# Patient Record
Sex: Female | Born: 1960 | Race: Black or African American | Hispanic: No | Marital: Married | Smoking: Current every day smoker
Health system: Southern US, Community
[De-identification: ages and names within clinical notes are randomized; demographics above are authoritative.]

## PROBLEM LIST (undated history)

## (undated) DIAGNOSIS — Z789 Other specified health status: Secondary | ICD-10-CM

## (undated) DIAGNOSIS — M1990 Unspecified osteoarthritis, unspecified site: Secondary | ICD-10-CM

## (undated) HISTORY — PX: FINGER SURGERY: SHX640

## (undated) HISTORY — PX: LAPAROSCOPIC BILATERAL SALPINGO OOPHERECTOMY: SHX5890

---

## 2005-03-07 ENCOUNTER — Ambulatory Visit: Payer: Self-pay | Admitting: Urology

## 2007-06-30 ENCOUNTER — Ambulatory Visit: Payer: Self-pay | Admitting: Emergency Medicine

## 2007-09-09 ENCOUNTER — Ambulatory Visit: Payer: Self-pay | Admitting: Family Medicine

## 2008-01-31 ENCOUNTER — Emergency Department: Payer: Self-pay | Admitting: Emergency Medicine

## 2008-02-11 ENCOUNTER — Ambulatory Visit: Payer: Self-pay | Admitting: Family Medicine

## 2008-04-30 ENCOUNTER — Ambulatory Visit: Payer: Self-pay | Admitting: Internal Medicine

## 2008-08-10 ENCOUNTER — Ambulatory Visit: Payer: Self-pay | Admitting: Family Medicine

## 2008-08-27 ENCOUNTER — Ambulatory Visit: Payer: Self-pay | Admitting: Internal Medicine

## 2008-10-30 ENCOUNTER — Ambulatory Visit: Payer: Self-pay | Admitting: Internal Medicine

## 2009-08-12 ENCOUNTER — Ambulatory Visit: Payer: Self-pay | Admitting: Internal Medicine

## 2010-01-11 ENCOUNTER — Ambulatory Visit: Payer: Self-pay | Admitting: Family Medicine

## 2010-03-22 ENCOUNTER — Ambulatory Visit: Payer: Self-pay | Admitting: Internal Medicine

## 2011-07-18 ENCOUNTER — Ambulatory Visit: Payer: Self-pay | Admitting: Internal Medicine

## 2011-07-22 ENCOUNTER — Ambulatory Visit: Payer: Self-pay

## 2011-07-24 ENCOUNTER — Emergency Department: Payer: Self-pay | Admitting: Emergency Medicine

## 2011-07-24 LAB — BASIC METABOLIC PANEL
Anion Gap: 7 (ref 7–16)
BUN: 15 mg/dL (ref 7–18)
Calcium, Total: 8.6 mg/dL (ref 8.5–10.1)
Chloride: 106 mmol/L (ref 98–107)
Co2: 31 mmol/L (ref 21–32)
Creatinine: 0.81 mg/dL (ref 0.60–1.30)
EGFR (African American): >60
Glucose: 126 mg/dL — ABNORMAL HIGH (ref 65–99)
Osmolality: 289 (ref 275–301)
Potassium: 3.9 mmol/L (ref 3.5–5.1)
Sodium: 144 mmol/L (ref 136–145)

## 2011-07-24 LAB — RAPID INFLUENZA A&B ANTIGENS

## 2011-07-24 LAB — CBC
MCHC: 32.6 g/dL (ref 32.0–36.0)
Platelet: 314 10*3/uL (ref 150–440)
WBC: 4.3 10*3/uL (ref 3.6–11.0)

## 2011-07-29 ENCOUNTER — Emergency Department: Payer: Self-pay | Admitting: *Deleted

## 2011-07-29 LAB — COMPREHENSIVE METABOLIC PANEL
Albumin: 3.4 g/dL (ref 3.4–5.0)
BUN: 15 mg/dL (ref 7–18)
Bilirubin,Total: 0.3 mg/dL (ref 0.2–1.0)
Chloride: 105 mmol/L (ref 98–107)
Creatinine: 0.69 mg/dL (ref 0.60–1.30)
EGFR (African American): >60
Potassium: 3.6 mmol/L (ref 3.5–5.1)
SGOT(AST): 19 U/L (ref 15–37)
SGPT (ALT): 21 U/L
Sodium: 143 mmol/L (ref 136–145)
Total Protein: 6.9 g/dL (ref 6.4–8.2)

## 2011-07-29 LAB — CBC
MCHC: 32.6 g/dL (ref 32.0–36.0)
RBC: 3.78 10*6/uL — ABNORMAL LOW (ref 3.80–5.20)
RDW: 14.1 % (ref 11.5–14.5)

## 2011-07-29 LAB — TROPONIN I: Troponin-I: <0.02 ng/mL

## 2011-07-30 LAB — URINALYSIS, COMPLETE
Bilirubin,UR: NEGATIVE
Leukocyte Esterase: NEGATIVE
RBC,UR: NONE SEEN /HPF (ref 0–5)
Squamous Epithelial: 11
WBC UR: NONE SEEN /HPF (ref 0–5)

## 2011-07-30 LAB — PRO B NATRIURETIC PEPTIDE: B-Type Natriuretic Peptide: 133 pg/mL — ABNORMAL HIGH (ref 0–125)

## 2012-04-22 ENCOUNTER — Ambulatory Visit: Payer: Self-pay

## 2013-08-15 ENCOUNTER — Ambulatory Visit: Payer: Self-pay

## 2013-08-15 LAB — URINALYSIS, COMPLETE
Bilirubin,UR: NEGATIVE
Blood: NEGATIVE
Glucose,UR: NEGATIVE mg/dL (ref 0–75)
KETONE: NEGATIVE
LEUKOCYTE ESTERASE: NEGATIVE
Nitrite: NEGATIVE
Ph: 6.5 (ref 4.5–8.0)
RBC, UR: NONE SEEN /HPF (ref 0–5)
Specific Gravity: >=1.03 (ref 1.003–1.030)
WBC UR: NONE SEEN /HPF (ref 0–5)

## 2013-11-27 ENCOUNTER — Ambulatory Visit: Payer: Self-pay | Admitting: Family Medicine

## 2014-07-14 ENCOUNTER — Ambulatory Visit: Payer: Self-pay | Admitting: Family Medicine

## 2014-12-14 ENCOUNTER — Ambulatory Visit
Admission: EM | Admit: 2014-12-14 | Discharge: 2014-12-14 | Disposition: A | Discharge status: Home / Self Care | Payer: Managed Care, Other (non HMO) | Attending: Family Medicine | Admitting: Family Medicine

## 2014-12-14 ENCOUNTER — Encounter: Payer: Self-pay | Admitting: Gynecology

## 2014-12-14 DIAGNOSIS — B373 Candidiasis of vulva and vagina: Secondary | ICD-10-CM | POA: Diagnosis not present

## 2014-12-14 DIAGNOSIS — L80 Vitiligo: Secondary | ICD-10-CM | POA: Diagnosis not present

## 2014-12-14 DIAGNOSIS — R21 Rash and other nonspecific skin eruption: Secondary | ICD-10-CM | POA: Diagnosis present

## 2014-12-14 DIAGNOSIS — B3731 Acute candidiasis of vulva and vagina: Secondary | ICD-10-CM

## 2014-12-14 LAB — URINALYSIS COMPLETE WITH MICROSCOPIC (ARMC ONLY)
BACTERIA UA: NONE SEEN — AB
Bilirubin Urine: NEGATIVE
Glucose, UA: NEGATIVE mg/dL
KETONES UR: NEGATIVE mg/dL
Leukocytes, UA: NEGATIVE
NITRITE: NEGATIVE
Protein, ur: NEGATIVE mg/dL
Specific Gravity, Urine: 1.02 (ref 1.005–1.030)
pH: 6.5 (ref 5.0–8.0)

## 2014-12-14 LAB — WET PREP, GENITAL
Clue Cells Wet Prep HPF POC: NONE SEEN
TRICH WET PREP: NONE SEEN
WBC, Wet Prep HPF POC: NONE SEEN

## 2014-12-14 MED ORDER — FLUCONAZOLE 150 MG PO TABS: 150.0000 mg | ORAL_TABLET | Freq: Every day | ORAL | Status: DC

## 2014-12-14 NOTE — ED Notes (Signed)
Patient c/o rash at vaginal region x 1 month ago

## 2014-12-14 NOTE — ED Provider Notes (Signed)
CSN: [id number]     Arrival date & time 12/14/14  [id number] History   First MD Initiated Contact with Patient 12/14/14 [phone]     Chief Complaint  Patient presents with  . Rash   (Consider location/radiation/quality/duration/timing/severity/associated sxs/prior Treatment) HPI Comments: 54 yo female with a 1 month h/o a "rash" on the outside of her vagina. Denies any pain, blisters, discharge. States feels slight vaginal itching. Denies any trauma, fevers, chills.   History reviewed. No pertinent past medical history. Past Surgical History  Procedure Laterality Date  . Abdominal hysterectomy     Family History  Problem Relation Age of Onset  . Stroke Mother   . Cancer Father    History  Substance Use Topics  . Smoking status: Current Every Day Smoker -- 2.00 packs/day  . Smokeless tobacco: Not on file  . Alcohol Use: Yes   OB History    No data available     Review of Systems  Allergies  Review of patient's allergies indicates no known allergies.  Home Medications   Prior to Admission medications   Medication Sig Start Date End Date Taking? Authorizing Provider  fluconazole (DIFLUCAN) 150 MG tablet Take 1 tablet (150 mg total) by mouth daily. 12/14/14   Payton Mccallumrlando Hailly Fess, MD   BP 103/79 mmHg  Pulse 66  Temp(Src) 97.3 F (36.3 C) (Tympanic)  Ht 5\' 6"  (1.676 m)  Wt 120 lb (54.432 kg)  BMI 19.38 kg/m2  SpO2 100% Physical Exam  Constitutional: She appears well-developed and well-nourished. No distress.  Genitourinary:    No labial fusion. There is no tenderness or injury on the right labia. There is no tenderness or injury on the left labia. No erythema, tenderness or bleeding in the vagina. No foreign body around the vagina. No signs of injury around the vagina. Vaginal discharge (slight ) found.  Hypopigmented areas out labia, vulva bilaterally; no vesicular lesions or drainage  Lymphadenopathy:       Right: No inguinal adenopathy present.       Left: No inguinal  adenopathy present.  Skin: She is not diaphoretic.  Nursing note and vitals reviewed.   ED Course  Procedures (including critical care time) Labs Review Labs Reviewed  WET PREP, GENITAL - Abnormal; Notable for the following:    Yeast Wet Prep HPF POC MANY (*)    All other components within normal limits  URINALYSIS COMPLETEWITH MICROSCOPIC ([facility] ONLY) - Abnormal; Notable for the following:    Hgb urine dipstick TRACE (*)    Bacteria, UA NONE SEEN (*)    Squamous Epithelial / LPF 0-5 (*)    All other components within normal limits    Imaging Review No results found.   MDM   1. Yeast vaginitis   2. Vitiligo    Discharge Medication List as of 12/14/2014 10:57 AM    START taking these medications   Details  fluconazole (DIFLUCAN) 150 MG tablet Take 1 tablet (150 mg total) by mouth daily., Starting 12/14/2014, Until Discontinued, Normal      Plan: 1. Test results and diagnosis reviewed with patient 2. rx as per orders; risks, benefits, potential side effects reviewed with patient  3. F/u prn if symptoms worsen or don't improve    Payton Mccallumrlando Recardo Linn, MD 12/16/14 [phone]

## 2014-12-14 NOTE — Discharge Instructions (Signed)
Candidal Vulvovaginitis Candidal vulvovaginitis is an infection of the vagina and vulva. The vulva is the skin around the opening of the vagina. This may cause itching and discomfort in and around the vagina.  HOME CARE  Only take medicine as told by your doctor.  Do not have sex (intercourse) until the infection is healed or as told by your doctor.  Practice safe sex.  Tell your sex partner about your infection.  Do not douche or use tampons.  Wear cotton underwear. Do not wear tight pants or panty hose.  Eat yogurt. This may help treat and prevent yeast infections. GET HELP RIGHT AWAY IF:   You have a fever.  Your problems get worse during treatment or do not get better in 3 days.  You have discomfort, irritation, or itching in your vagina or vulva area.  You have pain after sex.  You start to get belly (abdominal) pain. MAKE SURE YOU:  Understand these instructions.  Will watch your condition.  Will get help right away if you are not doing well or get worse. Document Released: 09/30/2008 Document Revised: 07/09/2013 Document Reviewed: 09/30/2008 [facility] Patient Information 2015 [place], [place]. This information is not intended to replace advice given to you by your health care provider. Make sure you discuss any questions you have with your health care provider.  PUVA Treatment PUVA stands for psoralen and ultraviolet A. PUVA treatment involves using ultraviolet A (UVA) light and taking a medicine, psoralen, by mouth. UVA is a form of light that is found naturally in sunlight. UVA light can also be produced by fluorescent lightbulbs such as those found in tanning booths. Psoralen is medicine that makes your skin more sensitive to UVA light. In some skin diseases, such as psoriasis, the skin cells grow faster than normal. PUVA treatment helps to slow down the skin growth and improves the skin. LET YOUR CAREGIVER KNOW ABOUT:  Allergies to food or medicine.  Medicines  taken, including vitamins, herbs, eyedrops, over-the-counter medicines, and creams.  Use of steroids (by mouth or creams).  Previous problems with anesthetics or numbing medicines.  History of bleeding problems or blood clots.  Previous surgery.  Other health problems, including diabetes and kidney problems.  Possibility of pregnancy, if this applies. RISKS AND COMPLICATIONS  Skin cancer (melanoma and squamous cell carcinoma) from exposure to UVA light.  Excessive aging of the skin.  Serious burns if you are exposed to too much UVA light.  Cataracts. Wearing goggles during treatment and UV-blocking sunglasses when outside reduces this risk. BEFORE THE PROCEDURE Psoralen medicine is taken with food or milk 2 hours before your light treatment. This gives the sensitizing medicine time to take effect. PROCEDURE  You will have complete privacy during the treatment.  You will apply safflower oil or another moisturizer to the skin or the entire body.  Covers will be provided to cover private areas in men, and to cover nipples in women.  Goggles will be worn during the entire treatment. If possible, the eyes should remain closed as well.  Treatments are done in a full body machine or in a hand and foot machine. This depends on what parts of the body need treatment. The machines are lined with fluorescent lightbulbs. It is important not to touch or come near the lightbulbs.  Treatments will begin with small doses of light for just a few seconds. This is done to gradually build up a tolerance and not get sunburned. Over time, the length of treatments will  be increased.  There will be a support bar to hold throughout the treatment.  You may need to raise your arms part of the time to expose the underarms.  You may need to keep your legs apart.  There is a "hold" button to press if you feel dizzy, feel burning of the skin, or need help. This will stop the treatment and will call the  nurse. AFTER THE PROCEDURE  You can safely drive home and return to regular activities immediately after the procedure.  Ask your caregiver when you need to return for your next treatment. You will have treatments 2 or 3 times a week. Document Released: 03/17/2011 Document Revised: 09/26/2011 Document Reviewed: 03/17/2011 [facility] Patient Information 2015 [place], [place]. This information is not intended to replace advice given to you by your health care provider. Make sure you discuss any questions you have with your health care provider.  PUVA Treatment PUVA stands for psoralen and ultraviolet A. PUVA treatment involves using ultraviolet A (UVA) light and taking a medicine, psoralen, by mouth. UVA is a form of light that is found naturally in sunlight. UVA light can also be produced by fluorescent lightbulbs such as those found in tanning booths. Psoralen is medicine that makes your skin more sensitive to UVA light. In some skin diseases, such as psoriasis, the skin cells grow faster than normal. PUVA treatment helps to slow down the skin growth and improves the skin. LET YOUR CAREGIVER KNOW ABOUT:  Allergies to food or medicine.  Medicines taken, including vitamins, herbs, eyedrops, over-the-counter medicines, and creams.  Use of steroids (by mouth or creams).  Previous problems with anesthetics or numbing medicines.  History of bleeding problems or blood clots.  Previous surgery.  Other health problems, including diabetes and kidney problems.  Possibility of pregnancy, if this applies. RISKS AND COMPLICATIONS  Skin cancer (melanoma and squamous cell carcinoma) from exposure to UVA light.  Excessive aging of the skin.  Serious burns if you are exposed to too much UVA light.  Cataracts. Wearing goggles during treatment and UV-blocking sunglasses when outside reduces this risk. BEFORE THE PROCEDURE Psoralen medicine is taken with food or milk 2 hours before your light  treatment. This gives the sensitizing medicine time to take effect. PROCEDURE  You will have complete privacy during the treatment.  You will apply safflower oil or another moisturizer to the skin or the entire body.  Covers will be provided to cover private areas in men, and to cover nipples in women.  Goggles will be worn during the entire treatment. If possible, the eyes should remain closed as well.  Treatments are done in a full body machine or in a hand and foot machine. This depends on what parts of the body need treatment. The machines are lined with fluorescent lightbulbs. It is important not to touch or come near the lightbulbs.  Treatments will begin with small doses of light for just a few seconds. This is done to gradually build up a tolerance and not get sunburned. Over time, the length of treatments will be increased.  There will be a support bar to hold throughout the treatment.  You may need to raise your arms part of the time to expose the underarms.  You may need to keep your legs apart.  There is a "hold" button to press if you feel dizzy, feel burning of the skin, or need help. This will stop the treatment and will call the nurse. AFTER THE PROCEDURE  You  can safely drive home and return to regular activities immediately after the procedure.  Ask your caregiver when you need to return for your next treatment. You will have treatments 2 or 3 times a week. Document Released: 03/17/2011 Document Revised: 09/26/2011 Document Reviewed: 03/17/2011 [facility] Patient Information 2015 [place]. This information is not intended to replace advice given to you by your health care provider. Make sure you discuss any questions you have with your health care provider.

## 2016-03-13 ENCOUNTER — Encounter: Payer: Self-pay | Admitting: Emergency Medicine

## 2016-03-13 ENCOUNTER — Ambulatory Visit
Admission: EM | Admit: 2016-03-13 | Discharge: 2016-03-13 | Disposition: A | Discharge status: Home / Self Care | Payer: Managed Care, Other (non HMO) | Attending: Family Medicine | Admitting: Family Medicine

## 2016-03-13 DIAGNOSIS — K1370 Unspecified lesions of oral mucosa: Secondary | ICD-10-CM

## 2016-03-13 MED ORDER — VALACYCLOVIR HCL 1 G PO TABS: 2000.0000 mg | ORAL_TABLET | Freq: Two times a day (BID) | ORAL | 0 refills | Status: AC

## 2016-03-13 NOTE — ED Provider Notes (Signed)
CSN: [id number]     Arrival date & time 03/13/16  4098 History   First MD Initiated Contact with Patient 03/13/16 [phone]     Chief Complaint  Patient presents with  . Mouth Lesions   (Consider location/radiation/quality/duration/timing/severity/associated sxs/prior Treatment) 55 year old female presents with oral bump/lesion on the roof of her mouth. She feels like the bump has been there for many years but it recently became larger and more painful with a sore in the middle of the bump. She denies any fever, URI symptoms or GI symptoms. She has gargled with salt water with minimal relief. She has not been to the dentist in about 2 years and smokes about 1/2 pack per day. She has no other chronic health issues and takes no daily medication.    The history is provided by the patient.  Mouth Lesions  Location:  Palate Palate location:  Hard Quality:  Ulcerous and painful Onset quality:  Gradual Severity:  Moderate Progression:  Worsening Context: not a change in medications   Relieved by:  Nothing Ineffective treatments: gargling with salt water. Associated symptoms: no congestion, no fever and no sore throat     History reviewed. No pertinent past medical history. Past Surgical History:  Procedure Laterality Date  . LAPAROSCOPIC BILATERAL SALPINGO OOPHERECTOMY     Family History  Problem Relation Age of Onset  . Stroke Mother   . Cancer Father    Social History  Substance Use Topics  . Smoking status: Current Every Day Smoker    Packs/day: 2.00  . Smokeless tobacco: Never Used  . Alcohol use Yes   OB History    No data available     Review of Systems  Constitutional: Negative for chills, fatigue, fever and unexpected weight change.  HENT: Positive for mouth sores. Negative for congestion, dental problem, postnasal drip and sore throat.   Respiratory: Negative for cough.   Gastrointestinal: Negative for diarrhea, nausea and vomiting.  Neurological: Negative for  dizziness and headaches.  Hematological: Negative.     Allergies  Review of patient's allergies indicates no known allergies.  Home Medications   Prior to Admission medications   Medication Sig Start Date End Date Taking? Authorizing Provider  valACYclovir (VALTREX) 1000 MG tablet Take 2 tablets (2,000 mg total) by mouth 2 (two) times daily. 03/13/16 03/14/16  Sudie Grumbling, NP   Meds Ordered and Administered this Visit  Medications - No data to display  BP 115/82 (BP Location: Left Arm)   Pulse 64   Temp 98.7 F (37.1 C) (Tympanic)   Resp 16   Ht 5\' 7"  (1.702 m)   Wt 110 lb (49.9 kg)   SpO2 97%   BMI 17.23 kg/m  No data found.   Physical Exam  Constitutional: She is oriented to person, place, and time. She appears well-developed and well-nourished. No distress.  HENT:  Head: Normocephalic and atraumatic.  Right Ear: Hearing, tympanic membrane, external ear and ear canal normal.  Left Ear: Hearing, tympanic membrane, external ear and ear canal normal.  Nose: Nose normal.  Mouth/Throat: Uvula is midline, oropharynx is clear and moist and mucous membranes are normal. Oral lesions present. No dental caries.    About 2cm oval hard flesh-colored raised lesion present on middle of palate. Has small ulcer in center. No discharge or drainage. Very tender. No other lesions seen.   Neck: Normal range of motion. Neck supple.  Lymphadenopathy:    She has no cervical adenopathy.  Neurological: She is  alert and oriented to person, place, and time.  Skin: Skin is warm and dry.  Psychiatric: She has a normal mood and affect. Her behavior is normal. Judgment and thought content normal.    Urgent Care Course   Clinical Course    Procedures (including critical care time)  Labs Review Labs Reviewed - No data to display  Imaging Review No results found.   Visual Acuity Review  Right Eye Distance:   Left Eye Distance:   Bilateral Distance:    Right Eye Near:   Left Eye  Near:    Bilateral Near:         MDM   1. Oral lesion    Consulted with Dr. Thurmond ButtsWade. Lesion appears to be calcifications of palate with acute ulcer- probably viral in nature. Will treat in case lesion is herpetic with Valtrex 2 g twice a day for 1 day. Recommend continue warm salt water gargles and take Ibuprofen 600mg  every 6 hours as needed for pain. Follow-up with her dentist within 5 to 7 days if not resolving.     Sudie GrumblingAnn Berry Akima Slaugh, NP 03/14/16 [phone]

## 2016-03-13 NOTE — ED Triage Notes (Signed)
Patient c/o sore on the roof of her mouth that started yesterday.  Patient denies fevers.

## 2016-03-13 NOTE — Discharge Instructions (Signed)
Start Valtrex 2 tablets twice a day for 1 day. May take Ibuprofen 600mg  every 6 hours as needed for pain. Continue gargling with warm salt water as needed. Follow-up with your dentist if lesion does not resolve within 5 to 7 days.

## 2016-08-23 ENCOUNTER — Ambulatory Visit
Admission: EM | Admit: 2016-08-23 | Discharge: 2016-08-23 | Disposition: A | Discharge status: Home / Self Care | Payer: Commercial Managed Care - PPO | Attending: Family Medicine | Admitting: Family Medicine

## 2016-08-23 DIAGNOSIS — J069 Acute upper respiratory infection, unspecified: Secondary | ICD-10-CM

## 2016-08-23 MED ORDER — BENZONATATE 200 MG PO CAPS: 200.0000 mg | ORAL_CAPSULE | Freq: Three times a day (TID) | ORAL | 0 refills | Status: DC

## 2016-08-23 MED ORDER — FLUTICASONE PROPIONATE 50 MCG/ACT NA SUSP: 2.0000 | Freq: Every day | NASAL | 0 refills | Status: DC

## 2016-08-23 MED ORDER — HYDROCOD POLST-CPM POLST ER 10-8 MG/5ML PO SUER: 5.0000 mL | Freq: Two times a day (BID) | ORAL | 0 refills | Status: DC

## 2016-08-23 NOTE — ED Provider Notes (Signed)
CSN: [id number]     Arrival date & time 08/23/16  1208 History   First MD Initiated Contact with Patient 08/23/16 1329     Chief Complaint  Patient presents with  . Influenza   (Consider location/radiation/quality/duration/timing/severity/associated sxs/prior Treatment) HPI  This 56 year old female presents with 5 day history of body aches chills and coughing. She does state that she had one episode of nausea and diarrhea this morning. Possibly this is from gagging from the coughing. He states that the coughing is a prominent feature of her illness. She has an O2 sat of 100% on room air. She is a smoker. States that she was using Tylenol 1 g every 4 hours     History reviewed. No pertinent past medical history. Past Surgical History:  Procedure Laterality Date  . LAPAROSCOPIC BILATERAL SALPINGO OOPHERECTOMY     Family History  Problem Relation Age of Onset  . Stroke Mother   . Cancer Father    Social History  Substance Use Topics  . Smoking status: Current Every Day Smoker    Packs/day: 2.00  . Smokeless tobacco: Never Used  . Alcohol use Yes   OB History    No data available     Review of Systems  Constitutional: Positive for activity change. Negative for chills, fatigue and fever.  HENT: Positive for congestion, postnasal drip and sore throat.   Respiratory: Positive for cough. Negative for shortness of breath, wheezing and stridor.   All other systems reviewed and are negative.   Allergies  Patient has no known allergies.  Home Medications   Prior to Admission medications   Medication Sig Start Date End Date Taking? Authorizing Provider  benzonatate (TESSALON) 200 MG capsule Take 1 capsule (200 mg total) by mouth every 8 (eight) hours. 08/23/16   Lutricia Feil, PA-C  chlorpheniramine-HYDROcodone (TUSSIONEX PENNKINETIC ER) 10-8 MG/5ML SUER Take 5 mLs by mouth 2 (two) times daily. 08/23/16   Lutricia Feil, PA-C  fluticasone (FLONASE) 50 MCG/ACT nasal spray Place 2  sprays into both nostrils daily. 08/23/16   Lutricia Feil, PA-C   Meds Ordered and Administered this Visit  Medications - No data to display  BP 115/87 (BP Location: Left Arm)   Pulse 61   Temp 98 F (36.7 C) (Oral)   Resp 18   Ht 5\' 6"  (1.676 m)   Wt 110 lb (49.9 kg)   SpO2 100%   BMI 17.75 kg/m  No data found.   Physical Exam  Constitutional: She is oriented to person, place, and time. She appears well-developed and well-nourished. No distress.  HENT:  Head: Normocephalic and atraumatic.  Right Ear: External ear normal.  Left Ear: External ear normal.  Nose: Nose normal.  Mouth/Throat: Oropharynx is clear and moist. No oropharyngeal exudate.  Eyes: EOM are normal. Pupils are equal, round, and reactive to light. Right eye exhibits no discharge. Left eye exhibits no discharge.  Neck: Normal range of motion. Neck supple.  Pulmonary/Chest: Effort normal and breath sounds normal. No respiratory distress. She has no wheezes. She has no rales.  Musculoskeletal: Normal range of motion.  Neurological: She is alert and oriented to person, place, and time.  Skin: Skin is warm and dry. She is not diaphoretic.  Psychiatric: She has a normal mood and affect. Her behavior is normal. Judgment and thought content normal.  Nursing note and vitals reviewed.   Urgent Care Course     Procedures (including critical care time)  Labs Review Labs Reviewed -  No data to display  Imaging Review No results found.   Visual Acuity Review  Right Eye Distance:   Left Eye Distance:   Bilateral Distance:    Right Eye Near:   Left Eye Near:    Bilateral Near:         MDM   1. Upper respiratory tract infection, unspecified type    . Discharge Medication List as of 08/23/2016  1:45 PM    START taking these medications   Details  benzonatate (TESSALON) 200 MG capsule Take 1 capsule (200 mg total) by mouth every 8 (eight) hours., Starting Tue 08/23/2016, Normal     chlorpheniramine-HYDROcodone (TUSSIONEX PENNKINETIC ER) 10-8 MG/5ML SUER Take 5 mLs by mouth 2 (two) times daily., Starting Tue 08/23/2016, Print    fluticasone (FLONASE) 50 MCG/ACT nasal spray Place 2 sprays into both nostrils daily., Starting Tue 08/23/2016, Normal      Plan: 1. Test/x-ray results and diagnosis reviewed with patient 2. rx as per orders; risks, benefits, potential side effects reviewed with patient 3. Recommend supportive treatment with Rest and fluids. Have advised her to stop the Tylenol. She should never take more than 4 g daily this was explained to her. We have also explained to her the proper use of an NSAIDs. I have encouraged her to stop smoking. If she is not improving she should follow-up with her primary care physician 4. F/u prn if symptoms worsen or don't improve     Lutricia FeilWilliam P Pj Zehner, PA-C 08/23/16 1358

## 2016-08-23 NOTE — ED Triage Notes (Signed)
Pt c/o feeling sick since Thursday, she is vomiting and diarrhea this morning. Nasal congestion, body aches, chills.

## 2016-08-25 ENCOUNTER — Telehealth: Payer: Self-pay

## 2016-08-25 NOTE — Telephone Encounter (Signed)
Courtesy call back completed today for patient's recent visit at [facility]. Patient did not answer, left message on machine to call back with any questions or concerns.   

## 2017-05-27 ENCOUNTER — Other Ambulatory Visit: Payer: Self-pay

## 2017-05-27 ENCOUNTER — Ambulatory Visit
Admission: EM | Admit: 2017-05-27 | Discharge: 2017-05-27 | Disposition: A | Discharge status: Home / Self Care | Payer: Commercial Managed Care - PPO | Attending: Family Medicine | Admitting: Family Medicine

## 2017-05-27 ENCOUNTER — Encounter: Payer: Self-pay | Admitting: Gynecology

## 2017-05-27 DIAGNOSIS — M25552 Pain in left hip: Secondary | ICD-10-CM | POA: Diagnosis not present

## 2017-05-27 MED ORDER — NAPROXEN 500 MG PO TABS: 500.0000 mg | ORAL_TABLET | Freq: Two times a day (BID) | ORAL | 0 refills | Status: DC

## 2017-05-27 MED ORDER — CYCLOBENZAPRINE HCL 10 MG PO TABS: 10.0000 mg | ORAL_TABLET | Freq: Two times a day (BID) | ORAL | 0 refills | Status: DC | PRN

## 2017-05-27 NOTE — ED Provider Notes (Signed)
[facility]    CSN: [id number] Arrival date & time: 05/27/17  1123     History   Chief Complaint Chief Complaint  Patient presents with  . Muscle Pain    HPI Corey Haroldlizabeth B Mcphie is a 56 y.o. female.   Patient is a 56 year old female who complains of pain to her left hip. Patient states last Thursday she was cleaning house and lifting heavy objects. She states she believes she pulled a muscle. She's been using icy hot, heating pad and has been taking ibuprofen 800 mg every 4 hours with minimal improvement in her pain. She reports also taking some aspirin. She denies any numbness or tingling. She reports the pain is mostly a constant ache but has stabbing/sharp pain with movement. Also, pain at night with turning.      History reviewed. No pertinent past medical history.  There are no active problems to display for this patient.   Past Surgical History:  Procedure Laterality Date  . LAPAROSCOPIC BILATERAL SALPINGO OOPHERECTOMY      OB History    No data available       Home Medications    Prior to Admission medications   Medication Sig Start Date End Date Taking? Authorizing Provider  cyclobenzaprine (FLEXERIL) 10 MG tablet Take 1 tablet (10 mg total) 2 (two) times daily as needed by mouth for muscle spasms. 05/27/17   Candis SchatzHarris, Quintana Canelo D, PA-C  naproxen (NAPROSYN) 500 MG tablet Take 1 tablet (500 mg total) 2 (two) times daily by mouth. 05/27/17   Candis SchatzHarris, Ajia Chadderdon D, PA-C    Family History Family History  Problem Relation Age of Onset  . Stroke Mother   . Cancer Father     Social History Social History   Tobacco Use  . Smoking status: Current Every Day Smoker    Packs/day: 2.00  . Smokeless tobacco: Never Used  Substance Use Topics  . Alcohol use: Yes  . Drug use: No     Allergies   Patient has no known allergies.   Review of Systems Review of Systems  As noted above in history of present illness. Other systems reviewed and found to  be negative   Physical Exam Triage Vital Signs ED Triage Vitals  Enc Vitals Group     BP 05/27/17 1217 (!) 148/94     Pulse Rate 05/27/17 1217 60     Resp 05/27/17 1217 16     Temp 05/27/17 1217 98.5 F (36.9 C)     Temp Source 05/27/17 1217 Oral     SpO2 05/27/17 1217 100 %     Weight 05/27/17 1216 110 lb (49.9 kg)     Height 05/27/17 1216 5\' 6"  (1.676 m)     Head Circumference --      Peak Flow --      Pain Score 05/27/17 1218 10     Pain Loc --      Pain Edu? --      Excl. in GC? --    No data found.  Updated Vital Signs BP (!) 148/94 (BP Location: Left Arm)   Pulse 60   Temp 98.5 F (36.9 C) (Oral)   Resp 16   Ht 5\' 6"  (1.676 m)   Wt 110 lb (49.9 kg)   SpO2 100%   BMI 17.75 kg/m   Physical Exam  Constitutional: She appears well-developed and well-nourished. No distress.  Musculoskeletal:       Left hip: She exhibits tenderness.  Decreased  for flexion due to pain. Minor tenderness to palpation of the left hip area. There is pain with patient moving to a standing position from sitting as well as moving to a laying position from sitting and moving back to a sitting position from lying. Patient also with pain with abduction and abduction but only mild pain with hip extension and flexion.     UC Treatments / Results  Labs (all labs ordered are listed, but only abnormal results are displayed) Labs Reviewed - No data to display  EKG  EKG Interpretation None       Radiology No results found.  Procedures Procedures (including critical care time)  Medications Ordered in UC Medications - No data to display   Initial Impression / Assessment and Plan / UC Course  I have reviewed the triage vital signs and the nursing notes.  Pertinent labs & imaging results that were available during my care of the patient were reviewed by me and considered in my medical decision making (see chart for details).     Patient with left hip pain after cleaning house and  moving heavy objects. Associated ache that worsens to a stabbing sharp pain with movement. Good range of motion but limited due to pain  Final Clinical Impressions(s) / UC Diagnoses   Final diagnoses:  Left hip pain   Will treat her with naproxen twice a day until symptoms improved as well as Flexeril. Also give her instructions for cold therapy as well as using, for her pain. Patient advised not to take ibuprofen while she is taking the naproxen.  ED Discharge Orders        Ordered    naproxen (NAPROSYN) 500 MG tablet  2 times daily     05/27/17 1302    cyclobenzaprine (FLEXERIL) 10 MG tablet  2 times daily PRN     05/27/17 1302       Controlled Substance Prescriptions [place] Controlled Substance Registry consulted? Not Applicable   Candis SchatzHarris, Devyon Keator D, PA-C 05/27/17 1304

## 2017-05-27 NOTE — ED Triage Notes (Signed)
Per patient x 1 week was moving stuff at home when she pull a muscle at her left hip / side..Patient c/o left side pain.

## 2017-05-27 NOTE — Discharge Instructions (Signed)
-  Naproxen: one tablet twice a day until improved -Do not take ibuprofen while taking this medication -can take Tylenol for pain as well -cyclobenzaprine: one tablet twice a day as needed for muscle spasm. Would take one at night to help with sleep. -cold pack as attached -return to clinic or PCP if no improvement

## 2017-06-07 ENCOUNTER — Other Ambulatory Visit: Payer: Self-pay

## 2017-06-07 ENCOUNTER — Ambulatory Visit (INDEPENDENT_AMBULATORY_CARE_PROVIDER_SITE_OTHER): Payer: Commercial Managed Care - PPO

## 2017-06-07 ENCOUNTER — Ambulatory Visit
Admission: EM | Admit: 2017-06-07 | Discharge: 2017-06-07 | Disposition: A | Discharge status: Home / Self Care | Payer: Self-pay | Attending: Family Medicine | Admitting: Family Medicine

## 2017-06-07 ENCOUNTER — Encounter: Payer: Self-pay | Admitting: Emergency Medicine

## 2017-06-07 DIAGNOSIS — S301XXA Contusion of abdominal wall, initial encounter: Secondary | ICD-10-CM | POA: Diagnosis not present

## 2017-06-07 DIAGNOSIS — M62830 Muscle spasm of back: Secondary | ICD-10-CM

## 2017-06-07 DIAGNOSIS — T148XXA Other injury of unspecified body region, initial encounter: Secondary | ICD-10-CM

## 2017-06-07 HISTORY — DX: Other specified health status: Z78.9

## 2017-06-07 MED ORDER — KETOROLAC TROMETHAMINE 60 MG/2ML IM SOLN
60.0000 mg | Freq: Once | INTRAMUSCULAR | Status: AC
  Administered 2017-06-07: 60 mg via INTRAMUSCULAR

## 2017-06-07 MED ORDER — TIZANIDINE HCL 4 MG PO CAPS: 4.0000 mg | ORAL_CAPSULE | Freq: Three times a day (TID) | ORAL | 0 refills | Status: DC

## 2017-06-07 NOTE — ED Triage Notes (Signed)
Patient in today c/o 2-3 week history of low back pain with radiation to left hip and leg. Patient was seen here on 05/27/17 for the same problem. Patient using muscle relaxer and pain medication as prescribed.

## 2017-06-07 NOTE — Discharge Instructions (Signed)
Avoid symptoms as much as possible.  Use heat on your back 20 minutes out of every 2 hours 4-5 times daily.  Stop taking the Flexeril and instead start taking Zanaflex for muscle spasm.

## 2017-06-07 NOTE — ED Provider Notes (Signed)
[facility]    CSN: [id number] Arrival date & time: 06/07/17  0844     History   Chief Complaint Chief Complaint  Patient presents with  . Back Pain    HPI Catherine Kelly is a 56 y.o. female.   HPI  55 year old female who returns today after being seen on 05/27/2017 for low back pain with radiation into the left hip and leg.  She states that initially she was cleaning out a storage shed and injured her back at that time.  Seen here she was diagnosed with a lumbar strain given Naprosyn and Flexeril.  Patient states that despite use of the medication she has not improved and has somewhat worsened.  Her pain is mostly over the left sacroiliac area extending into the left buttock and posterior thigh.  She has had no incontinence.  She has had no numbness or tingling.  She states that it does hurt when she ambulates.  Eyes any previous injury to her back.         Past Medical History:  Diagnosis Date  . No known health problems     There are no active problems to display for this patient.   Past Surgical History:  Procedure Laterality Date  . LAPAROSCOPIC BILATERAL SALPINGO OOPHERECTOMY      OB History    No data available       Home Medications    Prior to Admission medications   Medication Sig Start Date End Date Taking? Authorizing Provider  cyclobenzaprine (FLEXERIL) 10 MG tablet Take 1 tablet (10 mg total) 2 (two) times daily as needed by mouth for muscle spasms. 05/27/17  Yes Candis Schatz, PA-C  naproxen (NAPROSYN) 500 MG tablet Take 1 tablet (500 mg total) 2 (two) times daily by mouth. 05/27/17  Yes Candis Schatz, PA-C  tiZANidine (ZANAFLEX) 4 MG capsule Take 1 capsule (4 mg total) by mouth 3 (three) times daily. 06/07/17   Lutricia Feil, PA-C    Family History Family History  Problem Relation Age of Onset  . Stroke Mother   . Cancer Father     Social History Social History   Tobacco Use  . Smoking status: Current  Every Day Smoker    Packs/day: 2.00  . Smokeless tobacco: Never Used  Substance Use Topics  . Alcohol use: Yes    Alcohol/week: 1.2 oz    Types: 2 Cans of beer per week  . Drug use: No     Allergies   Patient has no known allergies.   Review of Systems Review of Systems  Constitutional: Positive for activity change. Negative for appetite change, chills, fatigue and fever.  Musculoskeletal: Positive for back pain.  All other systems reviewed and are negative.    Physical Exam Triage Vital Signs ED Triage Vitals [06/07/17 0855]  Enc Vitals Group     BP (!) 155/90     Pulse Rate 60     Resp 16     Temp 98.2 F (36.8 C)     Temp Source Oral     SpO2 100 %     Weight 110 lb (49.9 kg)     Height 5\' 6"  (1.676 m)     Head Circumference      Peak Flow      Pain Score 10     Pain Loc      Pain Edu?      Excl. in GC?    No data found.  Updated  Vital Signs BP (!) 155/90 (BP Location: Left Arm)   Pulse 60   Temp 98.2 F (36.8 C) (Oral)   Resp 16   Ht 5\' 6"  (1.676 m)   Wt 110 lb (49.9 kg)   SpO2 100%   BMI 17.75 kg/m   Visual Acuity Right Eye Distance:   Left Eye Distance:   Bilateral Distance:    Right Eye Near:   Left Eye Near:    Bilateral Near:     Physical Exam  Constitutional: She is oriented to person, place, and time. She appears well-developed and well-nourished. No distress.  HENT:  Head: Normocephalic.  Eyes: Pupils are equal, round, and reactive to light.  Neck: Normal range of motion.  Abdominal: Soft. She exhibits no distension and no mass. There is tenderness. There is no guarding.  Musculoskeletal: Normal range of motion. She exhibits tenderness.  Examination of the lumbar spine performed with Herbert SetaHeather, RN as Nurse, children'schaperone and assistant.  Patient did demonstrates good range of motion of the lumbar spine forward flexion extension and lateral flexion bilaterally.  She is able to toe and heel walk adequately.  There is no pain with range of motion  of either hip and knee show full range of motion.  Leg raise testing was negative at 90 degrees in the seated position.  DTR  1+/4 bilaterally symmetrical.  EHL, peroneal and anterior tibialis muscles are strong to clinical testing.  There is soft.  Bowel sounds are normal.  She does have tenderness along the oblique muscles near the lateral and anterior superior iliac crest which reproduces her symptoms.  This is on the left only.  The muscle does feel taut in comparison to the right.  Neurological: She is alert and oriented to person, place, and time.  Skin: Skin is warm and dry. She is not diaphoretic.  Psychiatric: She has a normal mood and affect. Her behavior is normal. Judgment and thought content normal.  Nursing note and vitals reviewed.    UC Treatments / Results  Labs (all labs ordered are listed, but only abnormal results are displayed) Labs Reviewed - No data to display  EKG  EKG Interpretation None       Radiology Dg Hip Unilat With Pelvis 2-3 Views Left  Result Date: 06/07/2017 CLINICAL DATA:  Contusion of iliac crest. EXAM: DG HIP (WITH OR WITHOUT PELVIS) 2-3V LEFT COMPARISON:  CT abdomen and pelvis 03/07/2005. 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 SI joints are symmetric and unremarkable. Sclerotic focus within the left sacrum is stable since CT from 2006 compatible with bone island. No fracture, subluxation or dislocation. 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 No acute bony abnormality. Electronically Signed   By: Charlett NoseKevin  Dover M.D.   On: 06/07/2017 09:50    Procedures Procedures (including critical care time)  Medications Ordered in UC Medications  ketorolac (TORADOL) injection 60 mg (60 mg Intramuscular Given 06/07/17 1001)     Initial Impression / Assessment and Plan / UC Course  I have reviewed the triage vital signs and the nursing notes.  Pertinent labs &  imaging results that were available during my care of the patient were reviewed by me and considered in my medical decision making (see chart for details).     Plan: 1. Test/x-ray results and diagnosis reviewed with patient 2. rx as per orders; risks, benefits, potential side effects reviewed with patient 3. Recommend supportive treatment with symptom avoidance and rest.  May use ice or heat on the area of pain 20 minutes out of every 2 hours 4-5 times daily for comfort.  Will stop the Flexeril for the time being.  Allow her to continue on with the Naprosyn.  We will change her to Zanaflex for muscle relaxation.  I have recommended that she follow-up with a primary care physician.  However she may return to our clinic at any time. 4. F/u prn if symptoms worsen or don't improve   Final Clinical Impressions(s) / UC Diagnoses   Final diagnoses:  Contusion of iliac crest, initial encounter  Muscle spasm of back  Muscle strain    ED Discharge Orders        Ordered    tiZANidine (ZANAFLEX) 4 MG capsule  3 times daily     06/07/17 1015       Controlled Substance Prescriptions [place] Controlled Substance Registry consulted? Not Applicable   Lutricia FeilRoemer, William P, PA-C 06/07/17 1023

## 2019-04-26 ENCOUNTER — Other Ambulatory Visit: Payer: Self-pay

## 2019-04-26 ENCOUNTER — Ambulatory Visit
Admission: EM | Admit: 2019-04-26 | Discharge: 2019-04-26 | Disposition: A | Discharge status: Home / Self Care | Payer: Commercial Managed Care - PPO

## 2019-04-26 ENCOUNTER — Encounter: Payer: Self-pay | Admitting: Emergency Medicine

## 2019-04-26 DIAGNOSIS — S8011XA Contusion of right lower leg, initial encounter: Secondary | ICD-10-CM

## 2019-04-26 DIAGNOSIS — T148XXA Other injury of unspecified body region, initial encounter: Secondary | ICD-10-CM

## 2019-04-26 NOTE — ED Triage Notes (Signed)
Patient c/o right lower leg that started yesterday.  Patient denies injury or fall.

## 2019-04-26 NOTE — Discharge Instructions (Signed)
It was very nice seeing you today in clinic. Thank you for entrusting me with your care.   No concern for DVT. Bruising likely benign, however it it continues, you will need to see your regular doctor to discuss blood workup.   Make arrangements to follow up with your regular doctor in 1 week for re-evaluation if not improving. If your symptoms/condition worsens, please seek follow up care either here or in the ER. Please remember, our [place] providers are "right here with you" when you need [place].   Again, it was my pleasure to take care of you today. Thank you for choosing our clinic. I hope that you start to feel better quickly.   Honor Loh, MSN, APRN, FNP-C, CEN Advanced Practice Provider [facility]

## 2019-04-26 NOTE — ED Provider Notes (Signed)
[place]   Name: Catherine Kelly DOB: 05-Mar-1961 MRN: [id number] CSN: [id number] PCP: Patient, No Pcp Per  Arrival date and time:  04/26/19 0240  Chief Complaint:  Leg Pain (right lower leg)   NOTE: Prior to seeing the patient today, I have reviewed the triage nursing documentation and vital signs. Clinical staff has updated patient's PMH/PSHx, current medication list, and drug allergies/intolerances to ensure comprehensive history available to assist in medical decision making.   History:   HPI: Catherine Kelly is a 58 y.o. female who presents today with complaints of "soreness" and bruising to her RIGHT lower extremity that started with acute onset yesterday. Patient denies any known injury. She has not appreciated any increased erythema or warmth in her leg. Patient denies claudication pain associated with ambulation and shortness of breath. Patient is not on any type of oral anticoagulation therapy, however she advises that she does take ASA on a daily basis. Patient has no other complaints today.   Past Medical History:  Diagnosis Date  . No known health problems     Past Surgical History:  Procedure Laterality Date  . LAPAROSCOPIC BILATERAL SALPINGO OOPHERECTOMY      Family History  Problem Relation Age of Onset  . Stroke Mother   . Cancer Father     Social History   Tobacco Use  . Smoking status: Current Every Day Smoker    Packs/day: 2.00  . Smokeless tobacco: Never Used  Substance Use Topics  . Alcohol use: Yes    Alcohol/week: 2.0 standard drinks    Types: 2 Cans of beer per week  . Drug use: No    There are no active problems to display for this patient.   Home Medications:    No outpatient medications have been marked as taking for the 04/26/19 encounter [facility] Encounter).    Allergies:   Patient has no known allergies.  Review of Systems (ROS): Review of Systems  Constitutional: Negative for chills and fever.  Respiratory:  Negative for cough and shortness of breath.   Cardiovascular: Negative for chest pain, palpitations and leg swelling.  Musculoskeletal:       RIGHT lower leg pain and bruising.   Hematological: Bruises/bleeds easily.  All other systems reviewed and are negative.    Vital Signs: Today's Vitals   04/26/19 0830 04/26/19 0833 04/26/19 0905  BP:  117/84   Pulse:  67   Resp:  14   Temp:  98.2 F (36.8 C)   TempSrc:  Oral   SpO2:  100%   Weight: 110 lb (49.9 kg)    Height: 5\' 6"  (1.676 m)    PainSc: 5   5     Physical Exam: Physical Exam  Constitutional: She is oriented to person, place, and time and well-developed, well-nourished, and in no distress. No distress.  HENT:  Head: Normocephalic and atraumatic.  Mouth/Throat: Oropharynx is clear and moist.  Eyes: Pupils are equal, round, and reactive to light.  Neck: Normal range of motion. Neck supple.  Cardiovascular: Normal rate, regular rhythm, normal heart sounds and intact distal pulses. Exam reveals no gallop and no friction rub.  No murmur heard. Pulmonary/Chest: Effort normal and breath sounds normal. No respiratory distress. She has no wheezes. She has no rales.  Musculoskeletal: Normal range of motion.     Right lower leg: She exhibits tenderness. She exhibits no swelling and no deformity.       Legs:     Comments: No claudication pain; (-)  Homan's sign. No extremity swelling.   Neurological: She is alert and oriented to person, place, and time. Gait normal.  Skin: Skin is warm and dry. Bruising (scattered) noted. No rash noted. She is not diaphoretic.  Psychiatric: Mood, memory, affect and judgment normal.  Nursing note and vitals reviewed.   Urgent Care Treatments / Results:   LABS: PLEASE NOTE: all labs that were ordered this encounter are listed, however only abnormal results are displayed. Labs Reviewed - No data to display  EKG: -None  RADIOLOGY: No results found.  PROCEDURES: Procedures  MEDICATIONS  RECEIVED THIS VISIT: Medications - No data to display  PERTINENT CLINICAL COURSE NOTES/UPDATES:   Initial Impression / Assessment and Plan / Urgent Care Course:  Pertinent labs & imaging results that were available during my care of the patient were personally reviewed by me and considered in my medical decision making (see lab/imaging section of note for values and interpretations).  Catherine Kelly is a 58 y.o. female who presents to [facility] today with complaints of Leg Pain (right lower leg)   Patient is well appearing overall in clinic today. She does not appear to be in any acute distress. Presenting symptoms (see HPI) and exam as documented above. No claudication pain with ambulation or dorsiflexion of the RIGHT foot. No SOB. No concern for DVT/VTE. Patient with scattered bruising. She is on daily ASA therapy. Discussed that ASA is likely etiology of her symptoms. If bruising persists or becomes of more concern, one could consider further hematological evaluation for bleeding/clotting disorder, however at this jucture I think that would be premature given the acute nature of her symptoms. Advised to follow up with PCP to discuss further.   Discussed follow up with primary care physician in 1 week for re-evaluation. I have reviewed the follow up and strict return precautions for any new or worsening symptoms. Patient is aware of symptoms that would be deemed urgent/emergent, and would thus require further evaluation either here or in the emergency department. At the time of discharge, she verbalized understanding and consent with the discharge plan as it was reviewed with her. All questions were fielded by provider and/or clinic staff prior to patient discharge.    Final Clinical Impressions / Urgent Care Diagnoses:   Final diagnoses:  Contusion of right lower leg, initial encounter  Bruising    New Prescriptions:  [place] Controlled Substance Registry consulted? Not Applicable   No orders of the defined types were placed in this encounter.   Recommended Follow up Care:  Patient encouraged to follow up with the following provider within the specified time frame, or sooner as dictated by the severity of her symptoms. As always, she was instructed that for any urgent/emergent care needs, she should seek care either here or in the emergency department for more immediate evaluation.  Follow-up Information    PCP In 1 week.   Why: General reassessment if continuing to bruise.        NOTE: This note was prepared using Scientist, clinical (histocompatibility and immunogenetics) along with smaller Lobbyist. Despite my best ability to proofread, there is the potential that transcriptional errors may still occur from this process, and are completely unintentional.     Verlee Monte, NP 04/27/19 [id number]

## 2019-12-04 ENCOUNTER — Encounter (HOSPITAL_COMMUNITY): Payer: Self-pay | Admitting: Emergency Medicine

## 2019-12-04 ENCOUNTER — Emergency Department (HOSPITAL_COMMUNITY): Payer: Worker's Compensation

## 2019-12-04 ENCOUNTER — Other Ambulatory Visit: Payer: Self-pay

## 2019-12-04 ENCOUNTER — Emergency Department (HOSPITAL_COMMUNITY)
Admission: EM | Admit: 2019-12-04 | Discharge: 2019-12-04 | Disposition: A | Discharge status: Home / Self Care | Payer: Worker's Compensation | Attending: Emergency Medicine | Admitting: Emergency Medicine

## 2019-12-04 DIAGNOSIS — S62616A Displaced fracture of proximal phalanx of right little finger, initial encounter for closed fracture: Secondary | ICD-10-CM | POA: Insufficient documentation

## 2019-12-04 DIAGNOSIS — F17210 Nicotine dependence, cigarettes, uncomplicated: Secondary | ICD-10-CM | POA: Diagnosis not present

## 2019-12-04 DIAGNOSIS — Y990 Civilian activity done for income or pay: Secondary | ICD-10-CM | POA: Insufficient documentation

## 2019-12-04 DIAGNOSIS — Y929 Unspecified place or not applicable: Secondary | ICD-10-CM | POA: Diagnosis not present

## 2019-12-04 DIAGNOSIS — Y939 Activity, unspecified: Secondary | ICD-10-CM | POA: Insufficient documentation

## 2019-12-04 DIAGNOSIS — T07XXXA Unspecified multiple injuries, initial encounter: Secondary | ICD-10-CM | POA: Diagnosis present

## 2019-12-04 DIAGNOSIS — W1830XA Fall on same level, unspecified, initial encounter: Secondary | ICD-10-CM | POA: Insufficient documentation

## 2019-12-04 DIAGNOSIS — S62615A Displaced fracture of proximal phalanx of left ring finger, initial encounter for closed fracture: Secondary | ICD-10-CM | POA: Insufficient documentation

## 2019-12-04 MED ORDER — OXYCODONE-ACETAMINOPHEN 5-325 MG PO TABS
1.0000 | ORAL_TABLET | Freq: Once | ORAL | Status: AC
  Administered 2019-12-04: 1 via ORAL
  Filled 2019-12-04: qty 1

## 2019-12-04 MED ORDER — HYDROCODONE-ACETAMINOPHEN 5-325 MG PO TABS: 1.0000 | ORAL_TABLET | ORAL | 0 refills | Status: DC | PRN

## 2019-12-04 MED ORDER — LIDOCAINE HCL (PF) 1 % IJ SOLN
30.0000 mL | Freq: Once | INTRAMUSCULAR | Status: AC
  Administered 2019-12-04: 30 mL
  Filled 2019-12-04: qty 30

## 2019-12-04 NOTE — ED Notes (Signed)
Patient verbalizes understanding of discharge instructions. Opportunity for questioning and answers were provided. Armband removed by staff, pt discharged from ED.  

## 2019-12-04 NOTE — Progress Notes (Signed)
Orthopedic Tech Progress Note Patient Details:  Catherine Kelly 05-19-1961 [id number]  Ortho Devices Type of Ortho Device: Finger splint Ortho Device/Splint Location: LUE (Ring finger), RUE ( pinky finger) Ortho Device/Splint Interventions: Ordered, Application   Post Interventions Patient Tolerated: Well Instructions Provided: Adjustment of device, Care of device   [name] 12/04/2019, 10:12 PM

## 2019-12-04 NOTE — ED Provider Notes (Signed)
[facility] EMERGENCY DEPARTMENT Provider Note   CSN: [id number] Arrival date & time: 12/04/19  1751     History Chief Complaint  Patient presents with  . Finger Injury    Catherine Kelly is a 59 y.o. female.  HPI  Patient is a 59 year old female with no pertinent past medical history presented today with bilateral hand pain after falling.  She states she had a mechanical fall over a box and tripped forward and caught herself on the ground however she states in the process she injured 2 fingers.  She states that her left ring finger and right pinky finger are significantly painful and achy.  She states she went to an urgent care where she was told she had fractures and told to come to the emergency department.  She states that the pain is achy, constant, severe, 10/10, worse with movement and touch and throbbing in quality.  She states that she did not hit her head or lose consciousness.  Denies any blood thinner use.  States that she was feeling well and denies any chest pain, shortness of breath, headache, lightheadedness or dizziness prior to the fall or after.     Past Medical History:  Diagnosis Date  . No known health problems     There are no problems to display for this patient.   Past Surgical History:  Procedure Laterality Date  . LAPAROSCOPIC BILATERAL SALPINGO OOPHERECTOMY       OB History   No obstetric history on file.     Family History  Problem Relation Age of Onset  . Stroke Mother   . Cancer Father     Social History   Tobacco Use  . Smoking status: Current Every Day Smoker    Packs/day: 2.00  . Smokeless tobacco: Never Used  Substance Use Topics  . Alcohol use: Yes    Alcohol/week: 2.0 standard drinks    Types: 2 Cans of beer per week  . Drug use: No    Home Medications Prior to Admission medications   Medication Sig Start Date End Date Taking? Authorizing Provider  aspirin 325 MG tablet Take 325 mg by mouth daily.    Yes [provider]  HYDROcodone-acetaminophen (NORCO/VICODIN) 5-325 MG tablet Take 1 tablet by mouth every 4 (four) hours as needed. 12/04/19   Gailen Shelter, PA    Allergies    Patient has no known allergies.  Review of Systems   Review of Systems  Constitutional: Negative for fever.  HENT: Negative for congestion.   Respiratory: Negative for shortness of breath.   Cardiovascular: Negative for chest pain.  Gastrointestinal: Negative for abdominal distention.  Musculoskeletal:       Finger pain   Neurological: Negative for dizziness, syncope and headaches.    Physical Exam Updated Vital Signs BP 135/80   Pulse 86   Temp 98.5 F (36.9 C) (Oral)   Resp 14   Ht 5\' 7"  (1.702 m)   Wt 52.2 kg   SpO2 100%   BMI 18.01 kg/m   Physical Exam Vitals and nursing note reviewed.  Constitutional:      General: She is in acute distress.     Appearance: Normal appearance. She is not ill-appearing.  HENT:     Head: Normocephalic and atraumatic.     Comments: No obvious injuries to the head.  No tenderness of scalp Eyes:     General: No scleral icterus.       Right eye: No discharge.  Left eye: No discharge.     Conjunctiva/sclera: Conjunctivae normal.  Neck:     Comments: No midline neck tenderness or tenderness palpation of the neck musculature or shoulders. Cardiovascular:     Rate and Rhythm: Normal rate.  Pulmonary:     Effort: Pulmonary effort is normal.     Breath sounds: No stridor.  Musculoskeletal:     Comments: Patient is unable to flex, extend or move either the affected fingers.  This tenderness to palpation and obvious deformity of the left ring finger and right pinky finger.  Right pinky finger is angled ulnar and left ring finger is also deviated ulnar direction.  No tenderness to palpation of other fingers, wrists, elbows shoulders or upper extremities.  No hip, ankle or lower extremity tenderness to palpation.  Full range of motion of all  extremities other than left ring finger and right small finger  Skin:    General: Skin is warm and dry.     Capillary Refill: Somewhat delayed cap refill of pinky finger of right hand.  Normal cap refill for ring finger of left hand. Neurological:     Mental Status: She is alert and oriented to person, place, and time. Mental status is at baseline.     Comments: Sensation is intact in both affected fingers.     ED Results / Procedures / Treatments   Labs (all labs ordered are listed, but only abnormal results are displayed) Labs Reviewed - No data to display  EKG None  Radiology DG Finger Little Right  Result Date: 12/04/2019 CLINICAL DATA:  Status post reduction. EXAM: RIGHT LITTLE FINGER 2+V COMPARISON:  Dec 04, 2019 (7:24 p.m.) FINDINGS: Acute fracture is seen involving the proximal aspect of the proximal phalanx of the fifth right finger. There is gross anatomic alignment. There is no evidence of dislocation. There is no evidence of arthropathy or other focal bone abnormality. Mild soft tissue swelling is seen. IMPRESSION: Acute fracture of the proximal phalanx of the fifth right finger. Electronically Signed   By: Aram Candela M.D.   On: 12/04/2019 21:14   DG Finger Little Right  Result Date: 12/04/2019 CLINICAL DATA:  Larey Seat, deformity EXAM: RIGHT LITTLE FINGER 2+V COMPARISON:  None. FINDINGS: Frontal, oblique, lateral views of the right fifth digit are obtained. There is a transverse fracture at the base of the fifth proximal phalanx, with significant ulnar and dorsal angulation. Fracture line does not appear to involve the metacarpophalangeal joint. There is overlying soft tissue swelling. IMPRESSION: 1. Extra-articular transverse fracture at the base of the fifth proximal phalanx. Electronically Signed   By: Sharlet Salina M.D.   On: 12/04/2019 19:34   DG Finger Ring Left  Result Date: 12/04/2019 CLINICAL DATA:  59 year old female status post fall with left 4th finger  fracture. Post reduction. EXAM: LEFT RING FINGER 2+V COMPARISON:  1926 hours today. FINDINGS: Substantially reduced comminuted fracture of the head of the left 4th proximal phalanx. Near anatomic alignment, slight residual ulnar displacement. Fracture site highly comminuted, but the PIP seems to be spared as before. No new osseous abnormality. IMPRESSION: Near anatomic alignment now at the comminuted fracture of the head of the left 4th proximal phalanx. Electronically Signed   By: Odessa Fleming M.D.   On: 12/04/2019 21:15   DG Finger Ring Left  Result Date: 12/04/2019 CLINICAL DATA:  Larey Seat, deformity EXAM: LEFT RING FINGER 2+V COMPARISON:  None. FINDINGS: Frontal, oblique, and lateral views of the left fourth digit are obtained. There is  a comminuted displaced fracture distal margin fourth proximal phalanx, with significant volar displacement. I do not see any definite extension into the fourth proximal interphalangeal joint, though repeat imaging after reduction may be useful. There is overlying soft tissue swelling. IMPRESSION: 1. Significantly displaced and mildly comminuted extra-articular fracture distal margin fourth proximal phalanx. Electronically Signed   By: Sharlet Salina M.D.   On: 12/04/2019 19:37    Procedures Reduction of fracture  Date/Time: 12/04/2019 9:21 PM Performed by: Gailen Shelter, PA Authorized by: Gailen Shelter, PA  Consent: Verbal consent obtained. Risks and benefits: risks, benefits and alternatives were discussed Consent given by: patient Patient understanding: patient states understanding of the procedure being performed Patient consent: the patient's understanding of the procedure matches consent given Relevant documents: relevant documents present and verified Test results: test results available and properly labeled Imaging studies: imaging studies available Patient identity confirmed: verbally with patient and arm band Local anesthesia used: yes Anesthesia:  local infiltration  Anesthesia: Local anesthesia used: yes Local Anesthetic: lidocaine 2% without epinephrine Anesthetic total: 5 mL  Sedation: Patient sedated: no  Patient tolerance: patient tolerated the procedure well with no immediate complications Comments: Left ring finger with proximal phalanx fracture at the distal aspect it is displaced and angulated significantly.  Reduction was successful range of motion was regained.  Cap refill is intact.  Sensation was intact prior to reduction and unable to assess secondary to digital block.  Post reduction films reviewed and near anatomical reduction was successfully achieved.  Patient splinted by Ortho tech patient is distally neurovascularly intact after splinting.  Reduction of fracture  Date/Time: 12/05/2019 12:53 AM Performed by: Gailen Shelter, PA Authorized by: Gailen Shelter, PA  Consent: Verbal consent obtained. Risks and benefits: risks, benefits and alternatives were discussed Consent given by: patient Patient understanding: patient states understanding of the procedure being performed Patient consent: the patient's understanding of the procedure matches consent given Relevant documents: relevant documents present and verified Test results: test results available and properly labeled Imaging studies: imaging studies available Patient identity confirmed: verbally with patient and arm band Local anesthesia used: no  Anesthesia: Local anesthesia used: no  Sedation: Patient sedated: no  Patient tolerance: patient tolerated the procedure well with no immediate complications Comments: Right pinky finger with proximal phalanx fracture at the proximal aspect -- it is displaced and angulated significantly.  Reduction was successful range of motion was regained.  Cap refill is intact.  Sensation was intact prior to reduction and unable to assess secondary to digital block.  Post reduction films reviewed and near anatomical  reduction was successfully achieved.  Patient splinted by Ortho tech patient is distally neurovascularly intact after splinting.     (including critical care time) SPLINT APPLICATION Date/Time: 12:53 AM Authorized by: Gailen Shelter Consent: Verbal consent obtained. Risks and benefits: risks, benefits and alternatives were discussed Consent given by: patient Splint applied by: orthopedic technician Location details: left ring finger and left pinkie finger Splint type: finger splint Supplies used: metal splint with foam / coban Post-procedure: The splinted body part was neurovascularly unchanged following the procedure. Patient tolerance: Patient tolerated the procedure well with no immediate complications.   Medications Ordered in ED Medications  oxyCODONE-acetaminophen (PERCOCET/ROXICET) 5-325 MG per tablet 1 tablet (1 tablet Oral Given 12/04/19 2040)  lidocaine (PF) (XYLOCAINE) 1 % injection 30 mL (30 mLs Infiltration Given by Other 12/04/19 2040)    ED Course  I have reviewed the triage vital signs and the  nursing notes.  Pertinent labs & imaging results that were available during my care of the patient were reviewed by me and considered in my medical decision making (see chart for details).    MDM Rules/Calculators/A&P                      Patient with displaced fracture of the proximal phalanx of the left ring finger and proximal fracture of the right pinky finger.  She mechanical fall that occurred presently 1 PM today.  Patient given Percocet for significant pain.  X-ray reviewed by myself and shows displaced fractures of both of these digits of the proximal phalanx.  I provided patient with pain relief via digital block of both fingers and manually reduced and splinted these fingers.  Unable to assess sensation after reduction however she has sensation prior to digital block.  Cap refill intact and good movements after reduction.  Postreduction films show near anatomical  reduction.  Patient was splinted immediately after x-rays and will follow with hand surgery.  She was given the phone number of the hand surgeon on-call.  I discussed this case with my attending physician who cosigned this note including patient's presenting symptoms, physical exam, and planned diagnostics and interventions. Attending physician stated agreement with plan or made changes to plan which were implemented.   Attending physician assessed patient at bedside.  Patient will call hand surgery team tomorrow to schedule a follow-up appointment.  She will wear splint until instructed not to by hand surgery.  She is provided with Norco and Tylenol and ibuprofen recommendations.  For home use.  Final Clinical Impression(s) / ED Diagnoses Final diagnoses:  Displaced fracture of proximal phalanx of left ring finger, initial encounter for closed fracture  Displaced fracture of proximal phalanx of right little finger, initial encounter for closed fracture    Rx / DC Orders ED Discharge Orders         Ordered    HYDROcodone-acetaminophen (NORCO/VICODIN) 5-325 MG tablet  Every 4 hours PRN     12/04/19 2124           Tedd Sias, [place] 12/05/19 0055    Margette Fast, MD 12/05/19 [phone]

## 2019-12-04 NOTE — ED Triage Notes (Signed)
Patient arrives to ED from [facility] with complaints of closed displaced fractures of her right pinky finger and left ring finger after falling and catching herself at work. This occurred today around 1pm. Patient states that the swelling continues to get worse. X-rays done at the UC.

## 2019-12-04 NOTE — Discharge Instructions (Addendum)
Please call tomorrow morning to make an appointment with the hand surgeon Dr. Melvyn Novas.  Please inform the office that he had a displaced fracture of the proximal phalanx of your left ring finger and a displaced fracture of the proximal phalanx of your right pinky finger.  These were both reduced in the emergency department and you are placed in splints.  Please keep splint in place at all times until you are seen by hand surgery.  I provided you with pain medication to use as needed for severe pain. Please use Tylenol or ibuprofen for pain.  You may use 600 mg ibuprofen every 6 hours or 1000 mg of Tylenol every 6 hours.  You may choose to alternate between the 2.  This would be most effective.  Not to exceed 4 g of Tylenol within 24 hours.  Not to exceed 3200 mg ibuprofen 24 hours.

## 2019-12-11 ENCOUNTER — Telehealth (HOSPITAL_COMMUNITY): Payer: Self-pay

## 2020-09-14 IMAGING — DX DG FINGER RING 2+V*L*
3 series · 3 of 3 positions shown · non-contrast
Comparison: 9097 hours today.

CLINICAL DATA: 59-year-old female status post fall with left 4th
finger fracture. Post reduction.

EXAM:
LEFT RING FINGER 2+V

[finger ap]
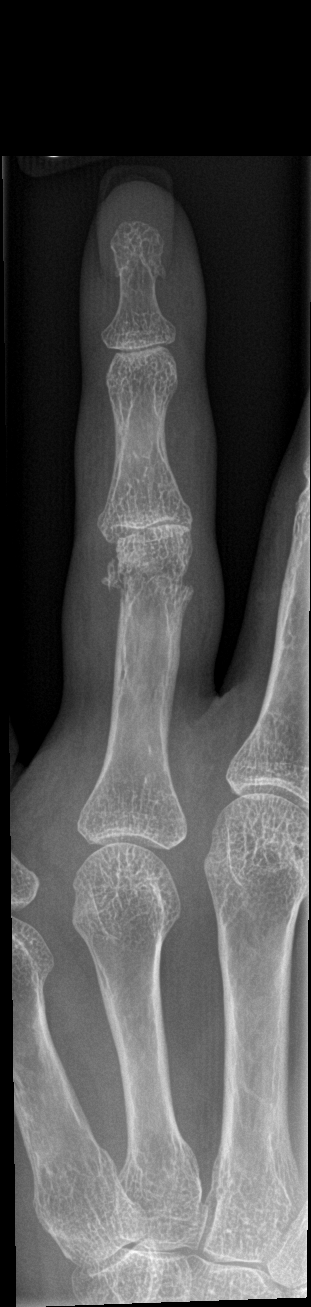

[finger lat]
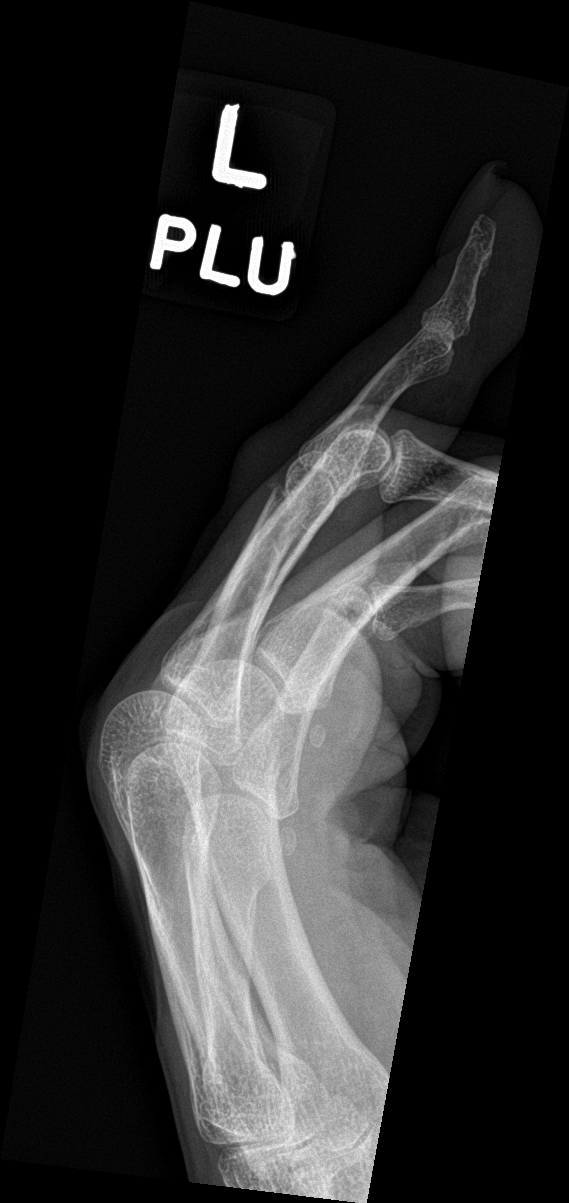

[finger obl]
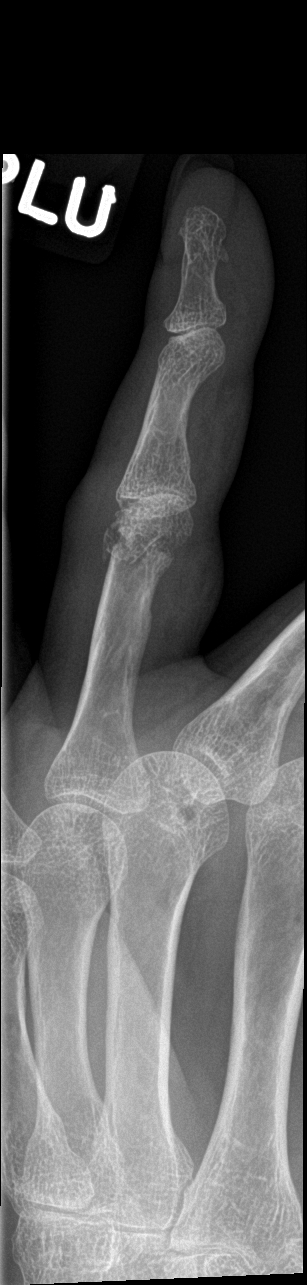

[3 of 3 positions shown; findings below may reference images not displayed]

FINDINGS: Substantially reduced comminuted fracture of the head of the left
4th proximal phalanx. Near anatomic alignment, slight residual ulnar
displacement. Fracture site highly comminuted, but the PIP seems to
be spared as before. No new osseous abnormality.
IMPRESSION: Near anatomic alignment now at the comminuted fracture of the head
of the left 4th proximal phalanx.

## 2020-10-28 ENCOUNTER — Ambulatory Visit
Admission: EM | Admit: 2020-10-28 | Discharge: 2020-10-28 | Disposition: A | Discharge status: Home / Self Care | Payer: Self-pay | Attending: Emergency Medicine | Admitting: Emergency Medicine

## 2020-10-28 ENCOUNTER — Other Ambulatory Visit: Payer: Self-pay

## 2020-10-28 DIAGNOSIS — G8921 Chronic pain due to trauma: Secondary | ICD-10-CM

## 2020-10-28 DIAGNOSIS — M79645 Pain in left finger(s): Secondary | ICD-10-CM

## 2020-10-28 MED ORDER — MELOXICAM 15 MG PO TABS: 15.0000 mg | ORAL_TABLET | Freq: Every day | ORAL | 0 refills | Status: AC

## 2020-10-28 NOTE — Discharge Instructions (Addendum)
discontinue the aspirin, try Mobic instead. take 2 extra strength Tylenols with it and an additional 1000 mg of Tylenol 2 or 3 more times a day as needed for pain.  Follow-up with Dr. Gwynneth Munson as soon as you can.  You may need further physical therapy.  Follow-up with a primary care provider of your choice for routine care.  See list below. Here is a list of primary care providers who are taking new patients:  Dr. Joya Sauer [address] [place] [address] [phone]  [facility] Primary [facility] at [facility] [address] [place], [place] [address] [phone]  [facility] [place] [address] [place] [address]  [phone]  [facility] [address] [place], [place] [address] [phone]  [facility] [address] [place]  [phone] [place], [place] [address]  Here are clinics/ other resources who will see you if you do not have insurance. Some have certain criteria that you must meet. Call them and find out what they are:  [facility]: [address], [place], [place] [address] Phone: [phone] Hours: First and Third Saturdays of each Month, 9 a.m. - 1 p.m.  [facility]: [address], [address] [place], [place] [address] Phone: [phone] Hours: Tuesday, 4 p.m. - 8 p.m. Thursday, 1 p.m. - 8 p.m. Wednesday, 9 a.m. - [facility] [address] [place], [place] [address] Phone: [phone] Pharmacy Phone Number: [phone] Dental Phone Number: [phone] [facility] Insurance Help: [phone]  Dental Hours: Monday - Thursday, 8 a.m. - 6 p.m.  [facility] [address], [place], [place] [address] Phone: [phone] Pharmacy Phone Number: [phone] [facility] Insurance Help: [phone]  [facility] [address] [place], [place] [address] Phone: [phone] Pharmacy Phone Number: [phone] [facility] Insurance Help: [phone]  [facility] [address] [place], [place] [address] Phone: [phone] [facility] Insurance Help: [phone]   [facility] [address] [place], [place] [address] Phone: [phone]  Go to www.goodrx.com to look up your medications. This will give you a list of where you can find your prescriptions at the most affordable prices. Or ask the pharmacist what the cash price is, or if they have any other discount programs available to help make your medication more affordable. This can be less expensive than what you would pay with insurance.

## 2020-10-28 NOTE — ED Triage Notes (Signed)
Pt c/o pain to her last 2 fingers on her left hand. She reports she had surgery to repair several broken fingers and since then has not been able to move the pinky on her left hand. She states she has seen her surgeon and he did not seem concerned. She states she is still in pain and doesn't know what to do.

## 2020-10-28 NOTE — ED Provider Notes (Signed)
HPI  SUBJECTIVE:  Catherine Kelly is a left-handed 60 y.o. female who presents with left little ring finger pain, stiffness, limitation of motion since fracturing her left ring finger in May 2021.  Left ring finger was treated with surgery, and both of her fingers were immobilized.  States that the ring finger is fine now.  She describes the pain in her little finger as sharp, intermittent, lasting hours.  States that she has good and bad days.  States that she has gone to physical therapy to try and work this out, without resolution of symptoms.  No recent trauma, erythema, swelling.  She has been taking aspirin alternating with Tylenol.  States that she ran out of opiates.  Aspirin, Tylenol help.  Symptoms worse with movement, PT, use.  Past medical history of smoking.  No history of chronic kidney disease, diabetes, hypertension.  PMD: None.  Hand: Dr. Melvyn Novas.  Per ER records, patient sustained displaced fracture  of the proximal phalanx left ring finger 5/21.  Last visit with hand was in January 2022.   Plan at that time was to see her back in 4 weeks for recheck radiographs and likely release.  No medications were given on that visit.  No notes available from physical therapy.    Past Medical History:  Diagnosis Date  . No known health problems     Past Surgical History:  Procedure Laterality Date  . FINGER SURGERY    . LAPAROSCOPIC BILATERAL SALPINGO OOPHERECTOMY      Family History  Problem Relation Age of Onset  . Stroke Mother   . Cancer Father     Social History   Tobacco Use  . Smoking status: Current Every Day Smoker    Packs/day: 2.00  . Smokeless tobacco: Never Used  Vaping Use  . Vaping Use: Never used  Substance Use Topics  . Alcohol use: Yes    Alcohol/week: 2.0 standard drinks    Types: 2 Cans of beer per week  . Drug use: No    No current facility-administered medications for this encounter.  Current Outpatient Medications:  .  meloxicam (MOBIC) 15  MG tablet, Take 1 tablet (15 mg total) by mouth daily for 7 days., Disp: 7 tablet, Rfl: 0  No Known Allergies   ROS  As noted in HPI.   Physical Exam  BP 113/88 (BP Location: Left Arm)   Pulse 83   Temp 98.5 F (36.9 C) (Oral)   Resp 18   Ht 5\' 7"  (1.702 m)   Wt 52.2 kg   SpO2 100%   BMI 18.01 kg/m   Constitutional: Well developed, well nourished, no acute distress Eyes:  EOMI, conjunctiva normal bilaterally HENT: Normocephalic, atraumatic,mucus membranes moist Respiratory: Normal inspiratory effort Cardiovascular: Normal rate GI: nondistended skin: No rash, skin intact Musculoskeletal: Normal appearance left little finger.  Positive tenderness at the PIP.  No tenderness of the proximal, middle, distal phalanx, DIP.  FDS/FDP intact.  No tenderness along the flexor tendons.  Patient is able to bend finger at the MCP, PIP, DIP but is limited compared to the rest of the fingers.  Two-point discrimination intact.  No erythema, swelling, bruising.  Cap refill less than 2 seconds.  Rest of the hand normal exam Neurologic: Alert & oriented x 3, no focal neuro deficits Psychiatric: Speech and behavior appropriate   ED Course   Medications - No data to display  No orders of the defined types were placed in this encounter.   No  results found for this or any previous visit (from the past 24 hour(s)). No results found.  ED Clinical Impression  1. Pain of finger of left hand   2. Chronic pain due to trauma      ED Assessment/Plan  Previous records reviewed.  As noted in HPI.  [facility] Narcotic database reviewed for this patient. Last opiate prescription was for 14 Percocet in December 21.  Patient with chronic pain, stiffness in her left little finger.  She is left-handed.  There is no evidence of infection.  There is no recent trauma, discussed with patient that I do not think that plain films would be particularly helpful, they may show arthritis or healed fracture, but there  would be nothing to do about this.  No evidence of infection.  She will need to follow-up with her hand surgeon.  We are going to discontinue the aspirin, try Mobic instead.  She is to take 2 extra strength Tylenols with it and an additional 1000 mg of Tylenol 2 or 3 more times a day as needed for pain.  Will provide primary care list for ongoing care and order assistance in finding a PMD.   Discussed  MDM, treatment plan, and plan for follow-up with patient. Discussed sn/sx that should prompt return to the ED. patient agrees with plan.   Meds ordered this encounter  Medications  . meloxicam (MOBIC) 15 MG tablet    Sig: Take 1 tablet (15 mg total) by mouth daily for 7 days.    Dispense:  7 tablet    Refill:  0      *This clinic note was created using Scientist, clinical (histocompatibility and immunogenetics). Therefore, there may be occasional mistakes despite careful proofreading.  ?    Domenick Gong, MD 10/28/20 [phone]

## 2020-11-02 ENCOUNTER — Telehealth: Payer: Self-pay

## 2020-11-02 NOTE — Telephone Encounter (Signed)
-----   Message from Aaron Edelman, RN sent at 10/30/2020 11:12 AM EDT ----- Regarding: UC to PCP Patient needs to establish with PCP - routine

## 2020-11-02 NOTE — Telephone Encounter (Signed)
Talked with patient to establish care with pcp she said she did not have insurance and will call back another time to set up appointment.

## 2021-04-29 ENCOUNTER — Other Ambulatory Visit: Payer: Self-pay

## 2021-04-29 ENCOUNTER — Ambulatory Visit
Admission: EM | Admit: 2021-04-29 | Discharge: 2021-04-29 | Disposition: A | Discharge status: Home / Self Care | Payer: Self-pay | Attending: Emergency Medicine | Admitting: Emergency Medicine

## 2021-04-29 DIAGNOSIS — S60561A Insect bite (nonvenomous) of right hand, initial encounter: Secondary | ICD-10-CM

## 2021-04-29 DIAGNOSIS — W57XXXA Bitten or stung by nonvenomous insect and other nonvenomous arthropods, initial encounter: Secondary | ICD-10-CM

## 2021-04-29 DIAGNOSIS — L03113 Cellulitis of right upper limb: Secondary | ICD-10-CM

## 2021-04-29 MED ORDER — PREDNISONE 10 MG (21) PO TBPK: ORAL_TABLET | ORAL | 0 refills | Status: DC

## 2021-04-29 NOTE — ED Triage Notes (Signed)
Pt c/o possible ant bites to her right hand. Pt states she was working in the yard yesterday and now has itching, swelling and some possible bites to her hand. Pt denies f/n/v/d or other symptoms.

## 2021-04-29 NOTE — ED Provider Notes (Signed)
[facility]    CSN: [id number] Arrival date & time: 04/29/21  1915      History   Chief Complaint Chief Complaint  Patient presents with   Insect Bite    R hand    HPI Catherine Kelly is a 60 y.o. female.   HPI  61 year old female here for evaluation of insect bites.  Patient reports that she was cleaning up her parents grave yesterday afternoon when she thinks she got into some aunts that had amounts near the grave.  She states that last night she developed redness, itching, and swelling to her right hand.  Today the redness and swelling and itching have continued as well as the development of several pustules.  She has a pustule on the lateral aspect of her right fifth finger, 2 pustules in the webspace of her thumb and index finger, and 2 additional pustules back where her thumb meets her wrist.  There is mild erythema and swelling to the palm as well.  Patient denies any numbness or tingling in her fingers and she does not have any fever.  Past Medical History:  Diagnosis Date   No known health problems     There are no problems to display for this patient.   Past Surgical History:  Procedure Laterality Date   FINGER SURGERY     LAPAROSCOPIC BILATERAL SALPINGO OOPHERECTOMY      OB History   No obstetric history on file.      Home Medications    Prior to Admission medications   Medication Sig Start Date End Date Taking? Authorizing Provider  predniSONE (STERAPRED UNI-PAK 21 TAB) 10 MG (21) TBPK tablet Take 6 tablets on day 1, 5 tablets day 2, 4 tablets day 3, 3 tablets day 4, 2 tablets day 5, 1 tablet day 6 04/29/21  Yes Becky Augusta, NP    Family History Family History  Problem Relation Age of Onset   Stroke Mother    Cancer Father     Social History Social History   Tobacco Use   Smoking status: Every Day    Packs/day: 2.00    Types: Cigarettes   Smokeless tobacco: Never  Vaping Use   Vaping Use: Never used  Substance Use  Topics   Alcohol use: Yes    Alcohol/week: 2.0 standard drinks    Types: 2 Cans of beer per week   Drug use: No     Allergies   Patient has no known allergies.   Review of Systems Review of Systems  Constitutional:  Negative for activity change, appetite change and fever.  Musculoskeletal:  Positive for joint swelling. Negative for arthralgias.  Skin:  Positive for color change.  Hematological: Negative.   Psychiatric/Behavioral: Negative.      Physical Exam Triage Vital Signs ED Triage Vitals  Enc Vitals Group     BP 04/29/21 1941 121/81     Pulse Rate 04/29/21 1941 66     Resp 04/29/21 1941 18     Temp 04/29/21 1941 98.1 F (36.7 C)     Temp Source 04/29/21 1941 Oral     SpO2 04/29/21 1941 100 %     Weight 04/29/21 1940 115 lb (52.2 kg)     Height 04/29/21 1940 5\' 6"  (1.676 m)     Head Circumference --      Peak Flow --      Pain Score 04/29/21 1939 7     Pain Loc --  Pain Edu? --      Excl. in GC? --    No data found.  Updated Vital Signs BP 121/81 (BP Location: Left Arm)   Pulse 66   Temp 98.1 F (36.7 C) (Oral)   Resp 18   Ht 5\' 6"  (1.676 m)   Wt 115 lb (52.2 kg)   SpO2 100%   BMI 18.56 kg/m   Visual Acuity Right Eye Distance:   Left Eye Distance:   Bilateral Distance:    Right Eye Near:   Left Eye Near:    Bilateral Near:     Physical Exam Vitals and nursing note reviewed.  Constitutional:      General: She is not in acute distress.    Appearance: Normal appearance. She is not ill-appearing.  Musculoskeletal:        General: Swelling present. No tenderness or deformity. Normal range of motion.  Skin:    General: Skin is warm and dry.     Capillary Refill: Capillary refill takes less than 2 seconds.     Findings: Erythema and lesion present.  Neurological:     General: No focal deficit present.     Mental Status: She is alert and oriented to person, place, and time.  Psychiatric:        Mood and Affect: Mood normal.         Behavior: Behavior normal.        Thought Content: Thought content normal.        Judgment: Judgment normal.     UC Treatments / Results  Labs (all labs ordered are listed, but only abnormal results are displayed) Labs Reviewed - No data to display  EKG   Radiology No results found.  Procedures Procedures (including critical care time)  Medications Ordered in UC Medications - No data to display  Initial Impression / Assessment and Plan / UC Course  I have reviewed the triage vital signs and the nursing notes.  Pertinent labs & imaging results that were available during my care of the patient were reviewed by me and considered in my medical decision making (see chart for details).  Patient is a very pleasant, nontoxic-appearing 60 year old female here for evaluation of insect bites to her right hand as outlined in HPI above.  Patient's hand is in normal anatomical alignment and there is mild swelling to the lateral aspect of the dorsum of the right hand.  There is some accompanying erythema in this region as well.  No fluctuance or induration noted there.  Patient does have a pustule on the lateral aspect of her right finger, the webspace between her thumb and index finger on her right hand, and back where her thumb and wrist meet there are 2 additional pustules.  There are other scattered maculopapular lesions throughout the back of the right hand.  Patient has full range of motion to her fingers.  Patient exam is consistent with ant bites and will treat patient symptoms with prednisone packed up with itching as well as over-the-counter antihistamines.  Patient vies return for increased redness, swelling, drainage, or fever.   Final Clinical Impressions(s) / UC Diagnoses   Final diagnoses:  Cellulitis of right upper extremity  Insect bite of right hand, initial encounter     Discharge Instructions      Take over-the-counter Allegra 180 mg daily or Zyrtec or Claritin 10 mg  daily to help with your itching.  You can take over-the-counter Benadryl, 50 mg at bedtime, as needed for  itching and sleep.  Take the prednisone pack according to the package instructions.  You will taken on tapering dose over a period of 6 days.  Take it with food and always take it first in the morning with breakfast.  Take over-the-counter Pepcid 20 mg twice daily to help with itching as well.  Return for increased redness, swelling, red streaks going up your arm, or fever.      ED Prescriptions     Medication Sig Dispense Auth. Provider   predniSONE (STERAPRED UNI-PAK 21 TAB) 10 MG (21) TBPK tablet Take 6 tablets on day 1, 5 tablets day 2, 4 tablets day 3, 3 tablets day 4, 2 tablets day 5, 1 tablet day 6 21 tablet Becky Augusta, NP      PDMP not reviewed this encounter.   Becky Augusta, NP 04/29/21 2003

## 2021-04-29 NOTE — Discharge Instructions (Addendum)
Take over-the-counter Allegra 180 mg daily or Zyrtec or Claritin 10 mg daily to help with your itching.  You can take over-the-counter Benadryl, 50 mg at bedtime, as needed for itching and sleep.  Take the prednisone pack according to the package instructions.  You will taken on tapering dose over a period of 6 days.  Take it with food and always take it first in the morning with breakfast.  Take over-the-counter Pepcid 20 mg twice daily to help with itching as well.  Return for increased redness, swelling, red streaks going up your arm, or fever.

## 2022-12-30 ENCOUNTER — Ambulatory Visit
Admission: EM | Admit: 2022-12-30 | Discharge: 2022-12-30 | Disposition: A | Discharge status: Home / Self Care | Payer: 59 | Attending: Emergency Medicine | Admitting: Emergency Medicine

## 2022-12-30 DIAGNOSIS — R030 Elevated blood-pressure reading, without diagnosis of hypertension: Secondary | ICD-10-CM

## 2022-12-30 DIAGNOSIS — J069 Acute upper respiratory infection, unspecified: Secondary | ICD-10-CM

## 2022-12-30 DIAGNOSIS — F17200 Nicotine dependence, unspecified, uncomplicated: Secondary | ICD-10-CM

## 2022-12-30 MED ORDER — DOXYCYCLINE HYCLATE 100 MG PO CAPS: 100.0000 mg | ORAL_CAPSULE | Freq: Two times a day (BID) | ORAL | 0 refills | Status: DC

## 2022-12-30 NOTE — ED Provider Notes (Signed)
[facility]    CSN: [id number] Arrival date & time: 12/30/22  1854      History   Chief Complaint Chief Complaint  Patient presents with   Cough    HPI Catherine Kelly is a 62 y.o. female.   62 year old female, Catherine Kelly, presents to urgent care with chief complaint of cough runny nose congestion for 10 days.  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  Patient has been taken over-the-counter cold medicine with minimal relief.  Patient smokes 4 cigarettes a day.  The history is provided by the patient. No language interpreter was used.    Past Medical History:  Diagnosis Date   No known health problems     Patient Active Problem List   Diagnosis Date Noted   Smoker 12/30/2022   Acute upper respiratory infection 12/30/2022   Elevated blood pressure reading 12/30/2022    Past Surgical History:  Procedure Laterality Date   FINGER SURGERY     LAPAROSCOPIC BILATERAL SALPINGO OOPHERECTOMY      OB History   No obstetric history on file.      Home Medications    Prior to Admission medications   Medication Sig Start Date End Date Taking? Authorizing Provider  doxycycline (VIBRAMYCIN) 100 MG capsule Take 1 capsule (100 mg total) by mouth 2 (two) times daily. 12/30/22  Yes Albirta Rhinehart, Para March, NP    Family History Family History  Problem Relation Age of Onset   Stroke Mother    Cancer Father     Social History Social History   Tobacco Use   Smoking status: Every Day    Packs/day: .5    Types: Cigarettes   Smokeless tobacco: Never  Vaping Use   Vaping Use: Never used  Substance Use Topics   Alcohol use: Yes    Alcohol/week: 2.0 standard drinks of alcohol    Types: 2 Cans of beer per week   Drug use: No     Allergies   Patient has no known allergies.   Review of Systems Review of Systems  Constitutional:  Negative for fever.  HENT:  Positive for congestion, sinus pressure and sinus pain.    Respiratory:  Positive for cough. Negative for shortness of breath and wheezing.   Cardiovascular:  Negative for chest pain and palpitations.  Gastrointestinal:  Negative for nausea and vomiting.  All other systems reviewed and are negative.    Physical Exam Triage Vital Signs ED Triage Vitals  Enc Vitals Group     BP 12/30/22 1918 (!) 168/96     Pulse Rate 12/30/22 1918 (!) 55     Resp 12/30/22 1918 16     Temp 12/30/22 1918 98.2 F (36.8 C)     Temp Source 12/30/22 1918 Oral     SpO2 12/30/22 1918 100 %     Weight --      Height --      Head Circumference --      Peak Flow --      Pain Score 12/30/22 1919 0     Pain Loc --      Pain Edu? --      Excl. in GC? --    No data found.  Updated Vital Signs BP (!) 168/96 (BP Location: Right Arm)   Pulse (!) 55   Temp 98.2 F (36.8 C) (Oral)   Resp 16   SpO2 100%   Visual Acuity Right Eye Distance:   Left Eye  Distance:   Bilateral Distance:    Right Eye Near:   Left Eye Near:    Bilateral Near:     Physical Exam Vitals and nursing note reviewed.  Constitutional:      General: She is not in acute distress.    Appearance: She is well-developed.  HENT:     Head: Normocephalic.     Right Ear: Tympanic membrane is retracted.     Left Ear: Tympanic membrane is retracted.     Nose: Mucosal edema and congestion present.     Right Sinus: Maxillary sinus tenderness present.     Left Sinus: Maxillary sinus tenderness present.     Mouth/Throat:     Lips: Pink.     Mouth: Mucous membranes are moist.     Pharynx: Oropharynx is clear.     Comments: +yellow PND noted Eyes:     General: Lids are normal.     Conjunctiva/sclera: Conjunctivae normal.     Pupils: Pupils are equal, round, and reactive to light.  Neck:     Trachea: No tracheal deviation.  Cardiovascular:     Rate and Rhythm: Regular rhythm. Bradycardia present.     Pulses: Normal pulses.     Heart sounds: Normal heart sounds. No murmur heard. Pulmonary:      Effort: Pulmonary effort is normal.     Breath sounds: Normal breath sounds and air entry.  Abdominal:     General: Bowel sounds are normal.     Palpations: Abdomen is soft.     Tenderness: There is no abdominal tenderness.  Musculoskeletal:        General: Normal range of motion.     Cervical back: Normal range of motion.  Lymphadenopathy:     Cervical: No cervical adenopathy.  Skin:    General: Skin is warm and dry.     Findings: No rash.  Neurological:     General: No focal deficit present.     Mental Status: She is alert and oriented to person, place, and time.     GCS: GCS eye subscore is 4. GCS verbal subscore is 5. GCS motor subscore is 6.  Psychiatric:        Attention and Perception: Attention normal.        Mood and Affect: Mood normal.        Speech: Speech normal.        Behavior: Behavior normal. Behavior is cooperative.      UC Treatments / Results  Labs (all labs ordered are listed, but only abnormal results are displayed) Labs Reviewed - No data to display  EKG   Radiology No results found.  Procedures Procedures (including critical care time)  Medications Ordered in UC Medications - No data to display  Initial Impression / Assessment and Plan / UC Course  I have reviewed the triage vital signs and the nursing notes.  Pertinent labs & imaging results that were available during my care of the patient were reviewed by me and considered in my medical decision making (see chart for details).    Discussed exam findings with patient and plan of care, will treat with antibiotics as patient has tried over-the-counter's for 10 days without relief of symptoms.  Patient verbalized understanding this provider  Ddx: Acute URI, smoker,elevated blood pressure Final Clinical Impressions(s) / UC Diagnoses   Final diagnoses:  Smoker  Acute upper respiratory infection  Elevated blood pressure reading     Discharge Instructions      Take  antibiotic as  directed, stop smoking.  May use over the counter meds for symptom management (Dayquil,Nyquil,tylenol,afrin,chloraseptic throat lozenges as label directed: Do not take tylenol while taking Dayquil or Nyquil as it has tylenol already in the medication). Push fluids: gatorade, pedilyte, jello,popsicles, chicken noodle soup,etc. Avoid caffeine products. Return as needed.  Please follow-up with PCP to get your blood pressure rechecked 2 to 3 days as it is elevated in urgent care today    ED Prescriptions     Medication Sig Dispense Auth. Provider   doxycycline (VIBRAMYCIN) 100 MG capsule Take 1 capsule (100 mg total) by mouth 2 (two) times daily. 20 capsule Haldon Carley, Para March, NP      PDMP not reviewed this encounter.   Clancy Gourd, NP 12/31/22 [phone]

## 2022-12-30 NOTE — ED Triage Notes (Signed)
Patient with c/o cough, runny nose and congestion for about a week. States she has been taking day/night cold and flu meds that help briefly but don't relieve the symptoms completely.

## 2022-12-30 NOTE — Discharge Instructions (Addendum)
Take antibiotic as directed, stop smoking.  May use over the counter meds for symptom management (Dayquil,Nyquil,tylenol,afrin,chloraseptic throat lozenges as label directed: Do not take tylenol while taking Dayquil or Nyquil as it has tylenol already in the medication). Push fluids: gatorade, pedilyte, jello,popsicles, chicken noodle soup,etc. Avoid caffeine products. Return as needed.  Please follow-up with PCP to get your blood pressure rechecked 2 to 3 days as it is elevated in urgent care today

## 2023-08-21 ENCOUNTER — Ambulatory Visit: Payer: Self-pay

## 2023-08-21 ENCOUNTER — Encounter: Payer: Self-pay | Admitting: Emergency Medicine

## 2023-08-21 ENCOUNTER — Ambulatory Visit
Admission: EM | Admit: 2023-08-21 | Discharge: 2023-08-21 | Disposition: A | Discharge status: Home / Self Care | Payer: 59 | Attending: Emergency Medicine | Admitting: Emergency Medicine

## 2023-08-21 DIAGNOSIS — J069 Acute upper respiratory infection, unspecified: Secondary | ICD-10-CM | POA: Insufficient documentation

## 2023-08-21 LAB — SARS CORONAVIRUS 2 BY RT PCR: SARS Coronavirus 2 by RT PCR: NEGATIVE

## 2023-08-21 MED ORDER — IPRATROPIUM BROMIDE 0.06 % NA SOLN: 2.0000 | Freq: Four times a day (QID) | NASAL | 12 refills | Status: DC

## 2023-08-21 MED ORDER — PROMETHAZINE-DM 6.25-15 MG/5ML PO SYRP: 5.0000 mL | ORAL_SOLUTION | Freq: Four times a day (QID) | ORAL | 0 refills | Status: DC | PRN

## 2023-08-21 MED ORDER — ONDANSETRON 8 MG PO TBDP: 8.0000 mg | ORAL_TABLET | Freq: Three times a day (TID) | ORAL | 0 refills | Status: DC | PRN

## 2023-08-21 MED ORDER — BENZONATATE 100 MG PO CAPS: 200.0000 mg | ORAL_CAPSULE | Freq: Three times a day (TID) | ORAL | 0 refills | Status: DC

## 2023-08-21 NOTE — ED Triage Notes (Signed)
Pt presents with a cough, nasal congestion and vomiting x 3 days.

## 2023-08-21 NOTE — ED Provider Notes (Signed)
[facility]    CSN: [id number] Arrival date & time: 08/21/23  0801      History   Chief Complaint Chief Complaint  Patient presents with   Cough   Emesis    HPI Catherine Kelly is a 63 y.o. female.   HPI  63 year old female with no significant past medical history and has a past surgical history of tonsillectomy and uvula removal presents for evaluation of flulike symptoms that began 3 days ago.  She reports that her thermometer does not have a battery so she was unable to take her temperature but she did experience sweating and chills.  She has had nasal congestion with a light yellow nasal discharge, sore throat, ear pain, nonproductive cough, shortness breath, and wheezing.  She has had 2 episodes of vomiting in the last 3 days and states that she is able to eat and drink.  She denies any diarrhea.  Past Medical History:  Diagnosis Date   No known health problems     Patient Active Problem List   Diagnosis Date Noted   Smoker 12/30/2022   Acute upper respiratory infection 12/30/2022   Elevated blood pressure reading 12/30/2022    Past Surgical History:  Procedure Laterality Date   FINGER SURGERY     LAPAROSCOPIC BILATERAL SALPINGO OOPHERECTOMY      OB History   No obstetric history on file.      Home Medications    Prior to Admission medications   Medication Sig Start Date End Date Taking? Authorizing Provider  benzonatate (TESSALON) 100 MG capsule Take 2 capsules (200 mg total) by mouth every 8 (eight) hours. 08/21/23  Yes Becky Augusta, NP  ipratropium (ATROVENT) 0.06 % nasal spray Place 2 sprays into both nostrils 4 (four) times daily. 08/21/23  Yes Becky Augusta, NP  ondansetron (ZOFRAN-ODT) 8 MG disintegrating tablet Take 1 tablet (8 mg total) by mouth every 8 (eight) hours as needed for nausea or vomiting. 08/21/23  Yes Becky Augusta, NP  promethazine-dextromethorphan (PROMETHAZINE-DM) 6.25-15 MG/5ML syrup Take 5 mLs by mouth 4 (four) times daily  as needed. 08/21/23  Yes Becky Augusta, NP    Family History Family History  Problem Relation Age of Onset   Stroke Mother    Cancer Father     Social History Social History   Tobacco Use   Smoking status: Every Day    Current packs/day: 0.50    Types: Cigarettes   Smokeless tobacco: Never  Vaping Use   Vaping status: Never Used  Substance Use Topics   Alcohol use: Yes    Alcohol/week: 2.0 standard drinks of alcohol    Types: 2 Cans of beer per week   Drug use: No     Allergies   Patient has no known allergies.   Review of Systems Review of Systems  Constitutional:  Positive for chills and diaphoresis. Negative for fever.  HENT:  Positive for congestion, ear pain, rhinorrhea and sore throat.   Respiratory:  Positive for cough, shortness of breath and wheezing.   Gastrointestinal:  Positive for nausea and vomiting. Negative for diarrhea.     Physical Exam Triage Vital Signs ED Triage Vitals  Encounter Vitals Group     BP      Systolic BP Percentile      Diastolic BP Percentile      Pulse      Resp      Temp      Temp src      SpO2  Weight      Height      Head Circumference      Peak Flow      Pain Score      Pain Loc      Pain Education      Exclude from Growth Chart    No data found.  Updated Vital Signs BP (!) 137/90 (BP Location: Left Arm)   Pulse 84   Temp 98.5 F (36.9 C) (Oral)   Resp 16   SpO2 96%   Visual Acuity Right Eye Distance:   Left Eye Distance:   Bilateral Distance:    Right Eye Near:   Left Eye Near:    Bilateral Near:     Physical Exam Vitals and nursing note reviewed.  Constitutional:      Appearance: Normal appearance. She is not ill-appearing.  HENT:     Head: Normocephalic and atraumatic.     Right Ear: There is impacted cerumen.     Left Ear: There is impacted cerumen.     Ears:     Comments: Bilateral tympanic membranes are occluded by cerumen.    Nose: Congestion and rhinorrhea present.     Comments:  Nasal mucosa is erythematous and mildly edematous with scant clear discharge in both nares.    Mouth/Throat:     Mouth: Mucous membranes are moist.     Pharynx: Oropharynx is clear. Posterior oropharyngeal erythema present. No oropharyngeal exudate.     Comments: Uvula and tonsils are both surgically absent.  Posterior oropharynx demonstrates mild erythema with clear postnasal drip. Cardiovascular:     Rate and Rhythm: Normal rate and regular rhythm.     Pulses: Normal pulses.     Heart sounds: Normal heart sounds. No murmur heard.    No friction rub. No gallop.  Pulmonary:     Effort: Pulmonary effort is normal.     Breath sounds: Normal breath sounds. No wheezing, rhonchi or rales.  Musculoskeletal:     Cervical back: Normal range of motion and neck supple. No tenderness.  Lymphadenopathy:     Cervical: No cervical adenopathy.  Skin:    General: Skin is warm and dry.     Capillary Refill: Capillary refill takes less than 2 seconds.     Findings: No rash.  Neurological:     General: No focal deficit present.     Mental Status: She is alert and oriented to person, place, and time.      UC Treatments / Results  Labs (all labs ordered are listed, but only abnormal results are displayed) Labs Reviewed  SARS CORONAVIRUS 2 BY RT PCR    EKG   Radiology No results found.  Procedures Procedures (including critical care time)  Medications Ordered in UC Medications - No data to display  Initial Impression / Assessment and Plan / UC Course  I have reviewed the triage vital signs and the nursing notes.  Pertinent labs & imaging results that were available during my care of the patient were reviewed by me and considered in my medical decision making (see chart for details).   Patient is a pleasant, nontoxic-appearing 63 year old female presenting for evaluation of COVID/flulike symptoms that began 3 days ago while outlined in HPI above.  She is complaining of shortness of  breath and wheezing though she is able to speak in full sentences without dyspnea or tachypnea.  Her respiratory rate at triage was 16 with a 96% room air oxygen saturation.  Cardiopulmonary exam reveals clear  lung sounds in all fields.  The remainder of her physical exam does reveal inflammation of her upper respiratory tract without any purulent nasal discharge or pharyngeal exudate.  Differential diagnosis include COVID, influenza, viral respiratory illness.  Given that she has had symptoms for the past 3 days she is outside the therapeutic window for antivirals for flu so I will not test her for that at this time.                                                                                                                                                                                                                                                                                                                                                                                                                    COVID PCR is negative.  I will discharge patient home with a diagnosis of viral URI with a cough with prescription for Atrovent nasal spray to open the nasal congestion, Tessalon Perles and Promethazine DM cough syrup for cough and congestion, and Zofran that she can use as needed for nausea.  ER and return precautions reviewed.  Work note provided.  Final Clinical Impressions(s) / UC Diagnoses   Final diagnoses:  Viral URI with cough     Discharge Instructions      You tested negative for COVID today.  Your physical exam and symptoms are consistent with a viral respiratory infection.  Please use over-the-counter Tylenol and/or ibuprofen according the package instructions as needed for any body aches you may experience.  You may use the Zofran every 8 hours as needed for nausea or vomiting.  You may place it on or under your tongue and will dissolve and be immediately absorbed.  Use  the Atrovent nasal spray, 2 squirts in each nostril every 6 hours, as needed for runny nose and postnasal drip.  Use the Tessalon Perles every 8 hours during the day.  Take them with a small sip of water.  They may give you some numbness to the base of your tongue or a metallic taste in your mouth, this is normal.  Use the Promethazine DM cough syrup at bedtime for cough and congestion.  It will make you drowsy so do not take it during the day.  Return for reevaluation or see your primary care provider for any new or worsening symptoms.      ED Prescriptions     Medication Sig Dispense Auth. Provider   benzonatate (TESSALON) 100 MG capsule Take 2 capsules (200 mg total) by mouth every 8 (eight) hours. 21 capsule Becky Augusta, NP   ipratropium (ATROVENT) 0.06 % nasal spray Place 2 sprays into both nostrils 4 (four) times daily. 15 mL Becky Augusta, NP   promethazine-dextromethorphan (PROMETHAZINE-DM) 6.25-15 MG/5ML syrup Take 5 mLs by mouth 4 (four) times daily as needed. 118 mL Becky Augusta, NP   ondansetron (ZOFRAN-ODT) 8 MG disintegrating tablet Take 1 tablet (8 mg total) by mouth every 8 (eight) hours as needed for nausea or vomiting. 20 tablet Becky Augusta, NP      PDMP not reviewed this encounter.   Becky Augusta, NP 08/21/23 [phone]

## 2023-08-21 NOTE — Discharge Instructions (Signed)
You tested negative for COVID today.  Your physical exam and symptoms are consistent with a viral respiratory infection.  Please use over-the-counter Tylenol and/or ibuprofen according the package instructions as needed for any body aches you may experience.  You may use the Zofran every 8 hours as needed for nausea or vomiting.  You may place it on or under your tongue and will dissolve and be immediately absorbed.  Use the Atrovent nasal spray, 2 squirts in each nostril every 6 hours, as needed for runny nose and postnasal drip.  Use the Tessalon Perles every 8 hours during the day.  Take them with a small sip of water.  They may give you some numbness to the base of your tongue or a metallic taste in your mouth, this is normal.  Use the Promethazine DM cough syrup at bedtime for cough and congestion.  It will make you drowsy so do not take it during the day.  Return for reevaluation or see your primary care provider for any new or worsening symptoms.

## 2023-09-24 ENCOUNTER — Ambulatory Visit
Admission: EM | Admit: 2023-09-24 | Discharge: 2023-09-24 | Disposition: A | Discharge status: Home / Self Care | Attending: Emergency Medicine | Admitting: Emergency Medicine

## 2023-09-24 DIAGNOSIS — R252 Cramp and spasm: Secondary | ICD-10-CM

## 2023-09-24 HISTORY — DX: Unspecified osteoarthritis, unspecified site: M19.90

## 2023-09-24 LAB — COMPREHENSIVE METABOLIC PANEL
ALT: 18 U/L (ref 0–44)
AST: 20 U/L (ref 15–41)
Albumin: 3.7 g/dL (ref 3.5–5.0)
Alkaline Phosphatase: 60 U/L (ref 38–126)
Anion gap: 8 (ref 5–15)
BUN: 14 mg/dL (ref 8–23)
CO2: 25 mmol/L (ref 22–32)
Calcium: 8.7 mg/dL — ABNORMAL LOW (ref 8.9–10.3)
Chloride: 106 mmol/L (ref 98–111)
Creatinine, Ser: 0.75 mg/dL (ref 0.44–1.00)
GFR, Estimated: >60 mL/min (ref >60)
Glucose, Bld: 101 mg/dL — ABNORMAL HIGH (ref 70–99)
Potassium: 4.1 mmol/L (ref 3.5–5.1)
Sodium: 139 mmol/L (ref 135–145)
Total Bilirubin: 0.3 mg/dL (ref 0.0–1.2)
Total Protein: 7.8 g/dL (ref 6.5–8.1)

## 2023-09-24 MED ORDER — IBUPROFEN 600 MG PO TABS: 600.0000 mg | ORAL_TABLET | Freq: Four times a day (QID) | ORAL | 0 refills | Status: AC | PRN

## 2023-09-24 MED ORDER — BACLOFEN 10 MG PO TABS: 10.0000 mg | ORAL_TABLET | Freq: Three times a day (TID) | ORAL | 0 refills | Status: AC

## 2023-09-24 NOTE — Discharge Instructions (Addendum)
 Your blood work did not show any evidence of electrolyte abnormalities.  You have a very mildly low calcium but I do not anticipate that this is the cause of your muscle cramps.  I would recommend that you increase your oral fluid intake to a goal of at least 64 ounces of water daily, more if you can tolerate it.  Mild dehydration can cause muscle cramping and if improve your hydration this may relieve your symptoms.  I am also going to prescribe you ibuprofen 600 mg that you can take every 6 hours as needed.  Take it with food.  This will help you with muscle inflammation and pain.  Additionally, I am prescribing you baclofen 10 mg that she can take every 8 hours.  This is a nonsedating muscle relaxant.  You may take this on an as-needed basis to see if it helps with your muscle cramps.  I recommend stretching out your upper arms and forearms as sometimes muscle tension can lead to cramping.  If your symptoms do not improve, or they worsen, I recommend that you follow-up with orthopedics such as [facility] here in [place] or in [place].

## 2023-09-24 NOTE — ED Triage Notes (Signed)
 Patient states "I think I am having muscles spasms in bilateral arm x 1 week. Pt states it eased up and she started taking motrin every 4 hours.

## 2023-09-24 NOTE — ED Provider Notes (Signed)
 [facility]    CSN: [id number] Arrival date & time: 09/24/23  4098      History   Chief Complaint No chief complaint on file.   HPI Catherine Kelly is a 63 y.o. female.   HPI  63 year old female with history of elevated blood pressure reading and tobacco use presents for evaluation of muscle spasms in both of her arms.  The left is worse than the right.  She reports that her muscle spasms started in her forearms and now are up in her upper arms as well.  She has had something similar in the past but she does not remember what it was ascribed to by [facility].  She has taken Motrin with some improvement of pain.  She denies any repetitive motion injury, typing, repetitive use, heavy lifting, injury, numbness or tingling in her fingers, or weakness in her grips.  Past Medical History:  Diagnosis Date   Arthritis    No known health problems     Patient Active Problem List   Diagnosis Date Noted   Smoker 12/30/2022   Acute upper respiratory infection 12/30/2022   Elevated blood pressure reading 12/30/2022    Past Surgical History:  Procedure Laterality Date   FINGER SURGERY     LAPAROSCOPIC BILATERAL SALPINGO OOPHERECTOMY      OB History   No obstetric history on file.      Home Medications    Prior to Admission medications   Medication Sig Start Date End Date Taking? Authorizing Provider  baclofen (LIORESAL) 10 MG tablet Take 1 tablet (10 mg total) by mouth 3 (three) times daily. 09/24/23  Yes Becky Augusta, NP  ibuprofen (ADVIL) 600 MG tablet Take 1 tablet (600 mg total) by mouth every 6 (six) hours as needed. 09/24/23  Yes Becky Augusta, NP    Family History Family History  Problem Relation Age of Onset   Stroke Mother    Cancer Father     Social History Social History   Tobacco Use   Smoking status: Every Day    Current packs/day: 0.50    Types: Cigarettes   Smokeless tobacco: Never  Vaping Use   Vaping status: Never Used  Substance Use  Topics   Alcohol use: Yes    Alcohol/week: 2.0 standard drinks of alcohol    Types: 2 Cans of beer per week   Drug use: No     Allergies   Patient has no known allergies.   Review of Systems Review of Systems  Musculoskeletal:  Positive for myalgias.  Neurological:  Negative for weakness and numbness.     Physical Exam Triage Vital Signs ED Triage Vitals  Encounter Vitals Group     BP      Systolic BP Percentile      Diastolic BP Percentile      Pulse      Resp      Temp      Temp src      SpO2      Weight      Height      Head Circumference      Peak Flow      Pain Score      Pain Loc      Pain Education      Exclude from Growth Chart    No data found.  Updated Vital Signs BP (!) 142/96 (BP Location: Right Arm)   Pulse 60   Temp 98.5 F (36.9 C) (Oral)  Ht 5\' 6"  (1.676 m)   Wt 130 lb (59 kg)   SpO2 98%   BMI 20.98 kg/m   Visual Acuity Right Eye Distance:   Left Eye Distance:   Bilateral Distance:    Right Eye Near:   Left Eye Near:    Bilateral Near:     Physical Exam Vitals and nursing note reviewed.  Constitutional:      Appearance: Normal appearance.  HENT:     Head: Normocephalic and atraumatic.  Musculoskeletal:        General: No swelling, tenderness or signs of injury.  Skin:    General: Skin is warm and dry.     Capillary Refill: Capillary refill takes less than 2 seconds.     Findings: No bruising or erythema.  Neurological:     General: No focal deficit present.     Mental Status: She is alert and oriented to person, place, and time.     Sensory: No sensory deficit.     Motor: No weakness.      UC Treatments / Results  Labs (all labs ordered are listed, but only abnormal results are displayed) Labs Reviewed  COMPREHENSIVE METABOLIC PANEL - Abnormal; Notable for the following components:      Result Value   Glucose, Bld 101 (*)    Calcium 8.7 (*)    All other components within normal limits     EKG   Radiology No results found.  Procedures Procedures (including critical care time)  Medications Ordered in UC Medications - No data to display  Initial Impression / Assessment and Plan / UC Course  I have reviewed the triage vital signs and the nursing notes.  Pertinent labs & imaging results that were available during my care of the patient were reviewed by me and considered in my medical decision making (see chart for details).   Patient is a nontoxic-appearing 63 year old female presenting for evaluation of muscle spasms in both of her arms as outlined HPI above.  In the exam room she has 5/5 grips bilaterally and no tenderness with palpation of the muscles of the forearm, bicep, or tricep bilaterally.  She has a negative Tinel's test and Phalen sign.  When doing the Phalen's test she did report an onset of muscle spasm/cramping in her left upper arm.  Etiology is unclear though differential diagnosis could be electrolyte imbalance, autoimmune disease, mild dehydration.  I will order a CMP to evaluate for any electrolyte or calcium disturbance.  CMP shows very mild hypocalcemia with a calcium of 8.7.  Sodium, potassium, chloride are all within normal limits as are renal function and transaminases.  Etiology of the patient's muscle spasms in both arms is unclear though I will treat her with 600 mg ibuprofen to help with pain and inflammation and 10 mg baclofen to help with muscle cramps.  I will have her increase her hydration to a goal of's 64 ounces of water or more a day to see if improving hydration helps her symptoms.  If her symptoms do not resolve, or they worsen, I recommend she follows up with orthopedics.   Final Clinical Impressions(s) / UC Diagnoses   Final diagnoses:  Muscle cramps     Discharge Instructions      Your blood work did not show any evidence of electrolyte abnormalities.  You have a very mildly low calcium but I do not anticipate that this is  the cause of your muscle cramps.  I would recommend that you increase  your oral fluid intake to a goal of at least 64 ounces of water daily, more if you can tolerate it.  Mild dehydration can cause muscle cramping and if improve your hydration this may relieve your symptoms.  I am also going to prescribe you ibuprofen 600 mg that you can take every 6 hours as needed.  Take it with food.  This will help you with muscle inflammation and pain.  Additionally, I am prescribing you baclofen 10 mg that she can take every 8 hours.  This is a nonsedating muscle relaxant.  You may take this on an as-needed basis to see if it helps with your muscle cramps.  I recommend stretching out your upper arms and forearms as sometimes muscle tension can lead to cramping.  If your symptoms do not improve, or they worsen, I recommend that you follow-up with orthopedics such as [facility] here in [place] or in [place].     ED Prescriptions     Medication Sig Dispense Auth. Provider   ibuprofen (ADVIL) 600 MG tablet Take 1 tablet (600 mg total) by mouth every 6 (six) hours as needed. 30 tablet Becky Augusta, NP   baclofen (LIORESAL) 10 MG tablet Take 1 tablet (10 mg total) by mouth 3 (three) times daily. 30 each Becky Augusta, NP      PDMP not reviewed this encounter.   Becky Augusta, NP 09/24/23 [phone]
